# Patient Record
Sex: Female | Born: 1985 | Hispanic: Yes | Marital: Married | Smoking: Never smoker
Health system: Southern US, Community
[De-identification: ages and names within clinical notes are randomized; demographics above are authoritative.]

## PROBLEM LIST (undated history)

## (undated) DIAGNOSIS — E785 Hyperlipidemia, unspecified: Secondary | ICD-10-CM

## (undated) DIAGNOSIS — T7840XA Allergy, unspecified, initial encounter: Secondary | ICD-10-CM

## (undated) DIAGNOSIS — K589 Irritable bowel syndrome without diarrhea: Secondary | ICD-10-CM

## (undated) HISTORY — DX: Irritable bowel syndrome, unspecified: K58.9

## (undated) HISTORY — DX: Allergy, unspecified, initial encounter: T78.40XA

---

## 2019-02-08 DIAGNOSIS — K5900 Constipation, unspecified: Secondary | ICD-10-CM | POA: Insufficient documentation

## 2019-02-08 DIAGNOSIS — K5909 Other constipation: Secondary | ICD-10-CM | POA: Insufficient documentation

## 2019-08-07 DIAGNOSIS — G43009 Migraine without aura, not intractable, without status migrainosus: Secondary | ICD-10-CM | POA: Insufficient documentation

## 2020-08-14 DIAGNOSIS — Z8759 Personal history of other complications of pregnancy, childbirth and the puerperium: Secondary | ICD-10-CM | POA: Insufficient documentation

## 2020-08-14 DIAGNOSIS — R1030 Lower abdominal pain, unspecified: Secondary | ICD-10-CM | POA: Insufficient documentation

## 2020-11-09 DIAGNOSIS — M25562 Pain in left knee: Secondary | ICD-10-CM | POA: Insufficient documentation

## 2021-03-22 ENCOUNTER — Emergency Department: Payer: Self-pay

## 2021-03-22 ENCOUNTER — Emergency Department
Admission: EM | Admit: 2021-03-22 | Discharge: 2021-03-22 | Disposition: A | Discharge status: Home / Self Care | Payer: Self-pay | Attending: Emergency Medicine | Admitting: Emergency Medicine

## 2021-03-22 ENCOUNTER — Other Ambulatory Visit: Payer: Self-pay

## 2021-03-22 DIAGNOSIS — S60011A Contusion of right thumb without damage to nail, initial encounter: Secondary | ICD-10-CM | POA: Insufficient documentation

## 2021-03-22 DIAGNOSIS — W230XXA Caught, crushed, jammed, or pinched between moving objects, initial encounter: Secondary | ICD-10-CM | POA: Insufficient documentation

## 2021-03-22 DIAGNOSIS — Y990 Civilian activity done for income or pay: Secondary | ICD-10-CM | POA: Insufficient documentation

## 2021-03-22 MED ORDER — LIDOCAINE HCL (PF) 1 % IJ SOLN
5.0000 mL | Freq: Once | INTRAMUSCULAR | Status: AC
  Administered 2021-03-22: 5 mL
  Filled 2021-03-22: qty 5

## 2021-03-22 MED ORDER — NAPROXEN 500 MG PO TABS: 500.0000 mg | ORAL_TABLET | Freq: Two times a day (BID) | ORAL | Status: DC

## 2021-03-22 MED ORDER — BACITRACIN-NEOMYCIN-POLYMYXIN 400-5-5000 EX OINT
TOPICAL_OINTMENT | Freq: Once | CUTANEOUS | Status: AC
  Administered 2021-03-22: 1 via TOPICAL
  Filled 2021-03-22: qty 1

## 2021-03-22 NOTE — ED Notes (Signed)
UDS and Blood drawn for worker comp  by Joyice Faster EDT  taken to lab

## 2021-03-22 NOTE — Discharge Instructions (Addendum)
X-ray was negative for fracture.  Read and follow discharge care instruction.  Take medication as directed.

## 2021-03-22 NOTE — ED Notes (Signed)
Dressing applied to thumb with no issue.

## 2021-03-22 NOTE — ED Provider Notes (Signed)
[facility] Emergency Department Provider Note   ____________________________________________   Event Date/Time   First MD Initiated Contact with Patient 03/22/21 1129     (approximate)  I have reviewed the triage vital signs and the nursing notes.   HISTORY  Chief Complaint Finger Injury    HPI Katherine Shea is a 35 y.o. female patient complain of right thumb pain secondary to a crush injury between 2 pallets at work.  Denies loss sensation or loss of function.  Rates pain as 7/10.  Described pain as "achy".  Dressing applied prior to arrival.  Patient is right-hand dominant.         History reviewed. No pertinent past medical history.  There are no problems to display for this patient.     Prior to Admission medications   Medication Sig Start Date End Date Taking? Authorizing Provider  naproxen (NAPROSYN) 500 MG tablet Take 1 tablet (500 mg total) by mouth 2 (two) times daily with a meal. 03/22/21  Yes Joni Reining, PA-C    Allergies Patient has no known allergies.  History reviewed. No pertinent family history.  Social History    Review of Systems  Constitutional: No fever/chills Eyes: No visual changes. ENT: No sore throat. Cardiovascular: Denies chest pain. Respiratory: Denies shortness of breath. Gastrointestinal: No abdominal pain.  No nausea, no vomiting.  No diarrhea.  No constipation. Genitourinary: Negative for dysuria. Musculoskeletal: Right thumb pain. Skin: Negative for rash.  Right thumb abrasion. Neurological: Negative for headaches, focal weakness or numbness. ____________________________________________   PHYSICAL EXAM:  VITAL SIGNS: ED Triage Vitals  Enc Vitals Group     BP 03/22/21 1113 132/80     Pulse Rate 03/22/21 1113 86     Resp 03/22/21 1113 18     Temp --      Temp src --      SpO2 03/22/21 1113 96 %     Weight 03/22/21 1114 121 lb 4.1 oz (55 kg)     Height 03/22/21 1114 5\' 1"   (1.549 m)     Head Circumference --      Peak Flow --      Pain Score 03/22/21 1116 7     Pain Loc --      Pain Edu? --      Excl. in GC? --    Constitutional: Alert and oriented. Well appearing and in no acute distress. Cardiovascular: Normal rate, regular rhythm. Grossly normal heart sounds.  Good peripheral circulation. Respiratory: Normal respiratory effort.  No retractions. Lungs CTAB. Musculoskeletal: No obvious deformity to the right thumb.  Patient is moderate guarding palpation. Neurologic:  Normal speech and language. No gross focal neurologic deficits are appreciated. No gait instability. Skin:  Skin is warm, dry and intact. No rash noted.  Abrasion to right thumb. Psychiatric: Mood and affect are normal. Speech and behavior are normal.  ____________________________________________   LABS (all labs ordered are listed, but only abnormal results are displayed)  Labs Reviewed - No data to display ____________________________________________  EKG   ____________________________________________  RADIOLOGY I, 05/22/21, personally viewed and evaluated these images (plain radiographs) as part of my medical decision making, as well as reviewing the written report by the radiologist.  ED MD interpretation: No acute findings x-ray of the right thumb.  Official radiology report(s): DG Finger Thumb Right  Result Date: 03/22/2021 CLINICAL DATA:  Crushing injury to the thumb. EXAM: RIGHT THUMB 2+V COMPARISON:  None. FINDINGS: The joint spaces  are maintained.  No acute fracture is identified. IMPRESSION: No acute bony findings. Electronically Signed   By: Rudie Meyer M.D.   On: 03/22/2021 12:04    ____________________________________________   PROCEDURES  Procedure(s) performed (including Critical Care):  Procedures   ____________________________________________   INITIAL IMPRESSION / ASSESSMENT AND PLAN / ED COURSE  As part of my medical decision making, I  reviewed the following data within the electronic MEDICAL RECORD NUMBER         Patient presents with pain edema secondary to contusion of right thumb between 2 pallets.  Discussed no acute fracture on x-ray.  Wound was clean bandaged and splinted.  Patient given discharge care instruction and prescription naproxen.  Return back if condition worsens.      ____________________________________________   FINAL CLINICAL IMPRESSION(S) / ED DIAGNOSES  Final diagnoses:  Contusion of right thumb without damage to nail, initial encounter     ED Discharge Orders          Ordered    naproxen (NAPROSYN) 500 MG tablet  2 times daily with meals        03/22/21 1224             Note:  This document was prepared using Dragon voice recognition software and may include unintentional dictation errors.    Joni Reining, PA-C 03/22/21 1227    Arnaldo Natal, MD 03/22/21 1622

## 2021-03-22 NOTE — ED Triage Notes (Signed)
First Nurse note:  While at work, right thumb was crushed between two pallets.  Patient works at [employer]

## 2021-05-02 ENCOUNTER — Other Ambulatory Visit: Payer: Self-pay

## 2021-05-02 ENCOUNTER — Encounter: Payer: Self-pay | Admitting: Family Medicine

## 2021-05-02 ENCOUNTER — Ambulatory Visit (INDEPENDENT_AMBULATORY_CARE_PROVIDER_SITE_OTHER): Payer: BC Managed Care – PPO | Admitting: Family Medicine

## 2021-05-02 VITALS — BP 124/94 | HR 66 | Temp 98.4°F | Ht 61.0 in | Wt 120.0 lb

## 2021-05-02 DIAGNOSIS — I881 Chronic lymphadenitis, except mesenteric: Secondary | ICD-10-CM

## 2021-05-02 MED ORDER — DICLOFENAC SODIUM 50 MG PO TBEC: 50.0000 mg | DELAYED_RELEASE_TABLET | Freq: Two times a day (BID) | ORAL | 0 refills | Status: DC | PRN

## 2021-05-02 NOTE — Patient Instructions (Signed)
-   Referral coordinator will contact you for mammogram and ultrasound tests - Referral coordinator will contact you for follow-up with gynecology - Can dose diclofenac every 12 hours on an as-needed basis for pain (take with food, no other NSAIDs while on this medication) - Can utilize ice at 20-minute intervals, heat, and Tylenol for additional pain control - Return for follow-up in 6 weeks, contact her office for any questions between now and then

## 2021-05-02 NOTE — Assessment & Plan Note (Signed)
Patient with 40-month history of left axillary "cyst "complaint, notes a fluctuation in size, no drainage from the area, notes intermittent erythema at the same area, very tender.  She denies any similar symptoms on the contralateral side, no history of similar symptoms in collaterally in the past, does have a history of mastitis during her 2 previous pregnancies involving the nipple area alone.  She denies any nipple discharge, no constitutional symptoms including fever, weight changes, etc.  Treatments have included 200 mg ibuprofen sporadically, ice, both with limited benefit.  Physical examination of the left axillary and upper-outer breast region reveals no skin color changes, no skin dimpling, no discrete masses.  Axillary hair is groomed without evidence of surrounding erythema.  At the 3-4 o'clock position from the left nipple roughly 6-7 cm superolaterally there is palpable tender nodularity/lymphadenopathy scattered in the distribution of the subscapular and pectoral distribution, contralateral shotty nontender nodularity in the same distribution.  No fluid can be expressed with palpation about the left-sided nodularity.  Plan for mammography and ultrasound studies, referral to gynecology, and as needed dosing of diclofenac in addition to supportive care.  We will coordinate a close follow-up follow-up in 6 weeks.

## 2021-05-02 NOTE — Progress Notes (Signed)
     Primary Care / Sports Medicine Office Visit  Patient Information:  Patient ID: Katherine Shea, female DOB: 25-Mar-1986 Age: 35 y.o. MRN: [id number]   Katherine Shea is a pleasant 35 y.o. 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  No results found.   Independent interpretation of notes and tests performed by another provider:   None  Procedures performed:   None  Pertinent History, Exam, Impression, and Recommendations:   Chronic axillary lymphadenitis Patient with 64-month history of left axillary "cyst "complaint, notes a fluctuation in size, no drainage from the area, notes intermittent erythema at the same area, very tender.  She denies any similar symptoms on the contralateral side, no history of similar symptoms in collaterally in the past, does have a history of mastitis during her 2 previous pregnancies involving the nipple area alone.  She denies any nipple discharge, no constitutional symptoms including fever, weight changes, etc.  Treatments have included 200 mg ibuprofen sporadically, ice, both with limited benefit.  Physical examination of the left axillary and upper-outer breast region reveals no skin color changes, no skin dimpling,  no discrete masses.  Axillary hair is groomed without evidence of surrounding erythema.  At the 3-4 o'clock position from the left nipple roughly 6-7 cm superolaterally there is palpable tender nodularity/lymphadenopathy scattered in the distribution of the subscapular and pectoral distribution, contralateral shotty nontender nodularity in the same distribution.  No fluid can be expressed with palpation about the left-sided nodularity.  Plan for mammography and ultrasound studies, referral to gynecology, and as needed dosing of diclofenac in addition to supportive care.  We will coordinate a close follow-up follow-up in 6 weeks.   Orders & Medications Meds ordered this encounter  Medications   diclofenac (VOLTAREN) 50 MG EC tablet    Sig: Take 1 tablet (50 mg total) by mouth 2 (two) times daily as needed (pain).    Dispense:  60 tablet    Refill:  0   Orders Placed This Encounter  Procedures   MM Digital Diagnostic Bilat   8-month BREAST BILATERAL   Ambulatory referral to Gynecology     Return in about 6 weeks (around 06/13/2021).     06/15/2021, MD   Primary Care Sports Medicine [facility]

## 2021-06-03 ENCOUNTER — Encounter: Payer: BC Managed Care – PPO | Admitting: Obstetrics and Gynecology

## 2021-06-18 ENCOUNTER — Ambulatory Visit: Payer: BC Managed Care – PPO | Admitting: Family Medicine

## 2021-07-05 ENCOUNTER — Encounter: Payer: BC Managed Care – PPO | Admitting: Obstetrics and Gynecology

## 2021-07-23 ENCOUNTER — Other Ambulatory Visit: Payer: Self-pay

## 2021-07-23 DIAGNOSIS — I881 Chronic lymphadenitis, except mesenteric: Secondary | ICD-10-CM

## 2021-08-06 ENCOUNTER — Other Ambulatory Visit: Payer: Self-pay

## 2021-08-06 ENCOUNTER — Inpatient Hospital Stay: Admission: RE | Admit: 2021-08-06 | Payer: Self-pay | Source: Ambulatory Visit

## 2021-08-15 ENCOUNTER — Encounter: Payer: BC Managed Care – PPO | Admitting: Obstetrics and Gynecology

## 2021-09-02 ENCOUNTER — Encounter: Payer: Self-pay | Admitting: Obstetrics and Gynecology

## 2021-09-02 ENCOUNTER — Other Ambulatory Visit (HOSPITAL_COMMUNITY)
Admission: RE | Admit: 2021-09-02 | Discharge: 2021-09-02 | Disposition: A | Discharge status: Home / Self Care | Payer: Self-pay | Source: Ambulatory Visit | Attending: Obstetrics and Gynecology | Admitting: Obstetrics and Gynecology

## 2021-09-02 ENCOUNTER — Other Ambulatory Visit: Payer: Self-pay

## 2021-09-02 ENCOUNTER — Ambulatory Visit (INDEPENDENT_AMBULATORY_CARE_PROVIDER_SITE_OTHER): Payer: BC Managed Care – PPO | Admitting: Obstetrics and Gynecology

## 2021-09-02 VITALS — BP 120/80 | Ht 60.0 in | Wt 126.0 lb

## 2021-09-02 DIAGNOSIS — Z124 Encounter for screening for malignant neoplasm of cervix: Secondary | ICD-10-CM | POA: Diagnosis not present

## 2021-09-02 DIAGNOSIS — R875 Abnormal microbiological findings in specimens from female genital organs: Secondary | ICD-10-CM | POA: Diagnosis not present

## 2021-09-02 DIAGNOSIS — R102 Pelvic and perineal pain: Secondary | ICD-10-CM | POA: Diagnosis not present

## 2021-09-02 DIAGNOSIS — R2232 Localized swelling, mass and lump, left upper limb: Secondary | ICD-10-CM

## 2021-09-02 DIAGNOSIS — N941 Unspecified dyspareunia: Secondary | ICD-10-CM

## 2021-09-02 DIAGNOSIS — N771 Vaginitis, vulvitis and vulvovaginitis in diseases classified elsewhere: Secondary | ICD-10-CM

## 2021-09-02 MED ORDER — METRONIDAZOLE 500 MG PO TABS: 500.0000 mg | ORAL_TABLET | Freq: Two times a day (BID) | ORAL | 0 refills | Status: DC

## 2021-09-02 NOTE — Patient Instructions (Signed)
Dolor p?lvico en la mujer ?Pelvic Pain, Female ?El dolor p?lvico se siente en la parte inferior del abdomen, debajo del ombligo y a nivel de las caderas. El dolor puede comenzar en forma repentina (ser agudo), reaparecer (ser recurrente) o durar mucho tiempo (volverse cr?nico). El dolor p?lvico que dura m?s de 6 meses se considera cr?nico. ?El dolor p?lvico puede afectar: ?Los ?rganos genitales. ?El sistema urinario. ?El tubo digestivo. ?El sistema musculoesquel?tico. ?El dolor p?lvico puede tener muchas causas posibles. A veces, el dolor puede ser Neomia Dear consecuencia de afecciones digestivas o urinarias, m?sculos o ligamentos distendidos, o afecciones del aparato reproductivo. A veces, la causa del dolor p?lvico no se conoce. ?Siga estas instrucciones en su casa: ? ?Use los medicamentos de venta libre y los recetados solamente como se lo haya indicado el m?dico. ?Haga reposo como se lo haya indicado el m?dico. ?No tenga sexo si siente dolor. ?Lleve un registro del dolor p?lvico. Escriba los siguientes datos: ?Cu?ndo comenz? Chief Technology Officer. ?La ubicaci?n del dolor. ?Qu? parece mejorar o empeorar el dolor, como alimentos o su per?odo mensual (ciclo menstrual). ?Cualquier s?ntoma que se presente junto con Chief Technology Officer. ?Concurra a todas las visitas de seguimiento. Esto es importante. ?Comun?quese con un m?dico si: ?El medicamento no [employer], o el dolor regresa. ?Aparecen nuevos s?ntomas. ?Tiene sangrado o secreci?n vaginal anormal, lo que incluye sangrado despu?s de la menopausia. ?Tiene fiebre o escalofr?os. ?Tiene estre?imiento. ?Observa sangre en la orina o en la materia fecal (heces). ?La orina tiene mal olor. ?Se siente d?bil o siente que va a desvanecerse. ?Solicite ayuda de inmediato si: ?Tiene un dolor repentino e intenso. ?El dolor se intensifica de [place] continua. ?Siente dolor intenso junto con fiebre, n?useas, v?mitos o sudoraci?n excesiva. ?Pierde la conciencia. ?Estos s?ntomas pueden representar un problema  grave que constituye Radio broadcast assistant. No espere a ver si los s?ntomas desaparecen. Solicite atenci?n m?dica de inmediato. Comun?quese con el servicio de emergencias de su localidad (911 en los [place]). No conduzca por sus propios medios [employer]. ?Resumen ?El dolor p?lvico se siente en la parte inferior del abdomen, debajo del ombligo y a nivel de las caderas. ?El dolor p?lvico puede tener muchas causas posibles. ?Lleve un registro del dolor p?lvico. ?Esta informaci?n no tiene Theme park manager el consejo del m?dico. Aseg?rese de hacerle al m?dico cualquier pregunta que tenga. ?Document Revised: 11/01/2020 Document Reviewed: 11/01/2020 ?Elsevier Patient Education ? 2022 Elsevier Inc. ? ?

## 2021-09-02 NOTE — Progress Notes (Signed)
? ?Patient ID: Katherine Shea, female   DOB: [birth year], 36 y.o.   MRN: [id number] ? ?Reason for Consult: Dyspareunia ?  ?Referred by Jerrol Banana, MD ? ?Subjective:  ?   ?HPI: ? ?Katherine Shea is a 35 y.o. female she is here today with complaints of pain with intercourse and breast concerns.  She previously was referred for mammogram and diagnostic breast imaging although she did not go.  She is uncomfortable and large mass in her left axilla which makes it difficult to wear a bra.  She reports that she missed her breast imaging appointments because she was sick with COVID. ? ?She reports that she has had pain with intercourse for about 1 year.  She reports that she that she generally has pain with penetration.  She reports difficulty to describe the pain but it feels like she is a full bottle and someone is trying to stuff more things inside.  She describes the pain as sharp and around the area of both of her ovaries.  She pain reports that the pain will come and go but is not always present.  She has noted no changes in discharge.  She has noted an abnormal odor especially around the time of her menstrual cycle which smells like rotten meat.  She takes Tylenol for the pain which sometimes helps.  She reports that the pain will linger for about 24 hours after intercourse.  Occasionally she will have painful menstrual cycles.  She has tried to treat herself with over-the-counter vaginal inserts with no improvement in her symptoms.  She reports the same sexual partner for the last 17 years. ? ?Gynecological History ? ?No LMP recorded. Patient has had an injection. ?Menarche: 10 ?Menopause: not applicable ? ?She reports passage of large clots ?She reports sensations of gushing or flooding of blood. ?She reports accidents where she bleeds through her clothing. ?She reports that she changes a saturated pad or tampon more frequently than every hour.  ?She denies that pain from her periods limits her  activities. ? ?History of fibroids, polyps, or ovarian cysts? : yes  ?History of PCOS? no ?Hstory of Endometriosis? no ?History of abnormal pap smears? yes ?Have you had any sexually transmitted infections in the past? no ? ?She reports HPV vaccination in the past.  ? ?Last Pap: Results were: 2020 NIL  ? ?She identifies as a female. She is sexually active with men.  ? She has dyspareunia. She denies postcoital bleeding.  ? ?Obstetrical History ?OB History  ?Gravida Para Term Preterm AB Living  ?2 2 2     2   ?SAB IAB Ectopic Multiple Live Births  ?           ?  ?# Outcome Date GA Lbr Len/2nd Weight Sex Delivery Anes PTL Lv  ?2 Term           ?1 Term           ? ? ? ?Past Medical History:  ?Diagnosis Date  ? Allergy   ? IBS (irritable bowel syndrome)   ? ?Family History  ?Problem Relation Age of Onset  ? Hypertension Mother   ? Asthma Brother   ? Asthma Daughter   ? Brain cancer Maternal Grandmother   ? Prostate cancer Maternal Grandfather   ? ?Past Surgical History:  ?Procedure Laterality Date  ? CESAREAN SECTION  2013  ? TUBAL LIGATION  2013  ? ? ?Short Social History:  ?Social History  ? ?Tobacco Use  ?  Smoking status: Never  ? Smokeless tobacco: Never  ?Substance Use Topics  ? Alcohol use: Never  ? ? ?No Known Allergies ? ?Current Outpatient Medications  ?Medication Sig Dispense Refill  ? polyethylene glycol powder (GLYCOLAX/MIRALAX) 17 GM/SCOOP powder Take 17 g by mouth in the morning and at bedtime.    ? diclofenac (VOLTAREN) 50 MG EC tablet Take 1 tablet (50 mg total) by mouth 2 (two) times daily as needed (pain). (Patient not taking: Reported on 09/02/2021) 60 tablet 0  ? TRULANCE 3 MG TABS Take 1 tablet by mouth daily. (Patient not taking: Reported on 09/02/2021)    ? ?No current facility-administered medications for this visit.  ? ? ?Review of Systems  ?Constitutional: Negative for chills, fatigue, fever and unexpected weight change.  ?HENT: Negative for trouble swallowing.  ?Eyes: Negative for loss of vision.   ?Respiratory: Negative for cough, shortness of breath and wheezing.  ?Cardiovascular: Negative for chest pain, leg swelling, palpitations and syncope.  ?GI: Negative for abdominal pain, blood in stool, diarrhea, nausea and vomiting.  ?GU: Negative for difficulty urinating, dysuria, frequency and hematuria.  ?Musculoskeletal: Negative for back pain, leg pain and joint pain.  ?Skin: Negative for rash.  ?Neurological: Negative for dizziness, headaches, light-headedness, numbness and seizures.  ?Psychiatric: Negative for behavioral problem, confusion, depressed mood and sleep disturbance.   ? ?   ?Objective:  ?Objective  ? ?Vitals:  ? 09/02/21 0916  ?BP: 120/80  ?Weight: 126 lb (57.2 kg)  ?Height: 5' (1.524 m)  ? ?Body mass index is 24.61 kg/m?. ? ?Physical Exam ?Vitals and nursing note reviewed. Exam conducted with a chaperone present.  ?Constitutional:   ?   Appearance: Normal appearance. She is well-developed.  ?HENT:  ?   Head: Normocephalic and atraumatic.  ?Eyes:  ?   Extraocular Movements: Extraocular movements intact.  ?   Pupils: Pupils are equal, round, and reactive to light.  ?Cardiovascular:  ?   Rate and Rhythm: Normal rate and regular rhythm.  ?Pulmonary:  ?   Effort: Pulmonary effort is normal. No respiratory distress.  ?   Breath sounds: Normal breath sounds.  ?Abdominal:  ?   General: Abdomen is flat.  ?   Palpations: Abdomen is soft.  ?Genitourinary: ?   Comments: External: Normal appearing vulva. No lesions noted.  ?Speculum examination: Normal appearing cervix. Small blood in the vaginal vault.  Bimanual examination: Uterus midline, +-tender, normal in size, shape and contour.  No CMT. No adnexal masses. +l tenderness. Pelvis not fixed. ? ?Breast exam: breast equal without skin changes, nipple discharge, breast lump or enlarged lymph nodes. Patient reports bilateral breast tenderness. Left axilla enlarged, possible lipoma ?Musculoskeletal:     ?   General: No signs of injury.  ?   Cervical back:  Normal range of motion.  ?Skin: ?   General: Skin is warm and dry.  ?Neurological:  ?   General: No focal deficit present.  ?   Mental Status: She is alert and oriented to person, place, and time.  ?Psychiatric:     ?   Behavior: Behavior normal.     ?   Thought Content: Thought content normal.     ?   Judgment: Judgment normal.  ? ? ?Assessment/Plan:  ?  ?36 yo G2P2002 ? ?Left axilla mass possible lipoma.  Encourage patient to follow-up with imaging.  Amatory referral for general surgery. ?Pelvic pain and dyspareunia.  We will treat for bacterial vaginosis with 7 days of Flagyl.  Pelvic ultrasound  ordered.  Infection testing today.  Follow-up after pelvic ultrasound.  Provided with information regarding pelvic pain. ?Pap smear today ?Return for follow-up after pelvic ultrasound ? ?More than 30 minutes were spent face to face with the patient in the room, reviewing the medical record, labs and images, and coordinating care for the patient. The plan of management was discussed in detail and counseling was provided.  ? ?  ? ?Adelene Idler MD ?[facility], [facility] ?09/02/2021 ?9:25 AM ? ? ?

## 2021-09-03 LAB — CYTOLOGY - PAP
Chlamydia: NEGATIVE
Comment: NEGATIVE
Comment: NEGATIVE
Comment: NEGATIVE
Comment: NORMAL
Diagnosis: NEGATIVE
High risk HPV: NEGATIVE
Neisseria Gonorrhea: NEGATIVE
Trichomonas: NEGATIVE

## 2021-09-04 LAB — NUSWAB BV AND CANDIDA, NAA
Candida albicans, NAA: NEGATIVE
Candida glabrata, NAA: NEGATIVE

## 2021-09-04 NOTE — Progress Notes (Signed)
Called pt, no answer, LVMTRC. 

## 2021-09-05 NOTE — Progress Notes (Signed)
Pt aware.

## 2021-09-10 ENCOUNTER — Ambulatory Visit: Payer: BC Managed Care – PPO | Admitting: Surgery

## 2021-09-16 ENCOUNTER — Ambulatory Visit: Admission: RE | Admit: 2021-09-16 | Payer: Self-pay | Source: Ambulatory Visit

## 2021-09-19 ENCOUNTER — Other Ambulatory Visit: Payer: BC Managed Care – PPO

## 2021-09-23 ENCOUNTER — Ambulatory Visit: Payer: BC Managed Care – PPO | Admitting: Surgery

## 2021-10-01 ENCOUNTER — Ambulatory Visit: Payer: BC Managed Care – PPO | Admitting: Obstetrics and Gynecology

## 2021-10-02 ENCOUNTER — Ambulatory Visit: Payer: BC Managed Care – PPO | Admitting: Surgery

## 2021-10-03 ENCOUNTER — Ambulatory Visit: Payer: BC Managed Care – PPO

## 2021-10-09 ENCOUNTER — Ambulatory Visit: Payer: BC Managed Care – PPO | Admitting: Surgery

## 2021-10-16 ENCOUNTER — Ambulatory Visit: Payer: BC Managed Care – PPO | Admitting: Obstetrics and Gynecology

## 2021-10-18 ENCOUNTER — Other Ambulatory Visit: Payer: Self-pay

## 2021-10-18 ENCOUNTER — Encounter: Payer: Self-pay | Admitting: Surgery

## 2021-10-18 ENCOUNTER — Ambulatory Visit (INDEPENDENT_AMBULATORY_CARE_PROVIDER_SITE_OTHER): Payer: BC Managed Care – PPO | Admitting: Surgery

## 2021-10-18 VITALS — BP 119/71 | HR 72 | Temp 98.0°F | Ht 62.0 in | Wt 126.0 lb

## 2021-10-18 DIAGNOSIS — N6011 Diffuse cystic mastopathy of right breast: Secondary | ICD-10-CM

## 2021-10-18 DIAGNOSIS — R2232 Localized swelling, mass and lump, left upper limb: Secondary | ICD-10-CM

## 2021-10-18 DIAGNOSIS — N6012 Diffuse cystic mastopathy of left breast: Secondary | ICD-10-CM | POA: Diagnosis not present

## 2021-10-18 NOTE — Patient Instructions (Addendum)
Your Mammogram is scheduled for 11/08/2021 at 1 pm @ [facility] at [facility].  ? ? ?If you have any concerns or questions, please feel free to call our office.  ? ? ?Autoexamen de mamas ?Breast Self-Awareness ?El autoexamen de mamas es para Katherine Shea, engineering apariencia y la sensibilidad de las [place]. Es necesario que respete estas indicaciones: ?Controle sus mamas peri?dicamente. ?Informe al m?dico si hay alg?n cambio. ?Familiar?cese con el aspecto y con la forma en que se sienten sus mamas al palparlas. Esto puede ayudarle a Nurse, children's en las mamas mientras todav?a es peque?o y puede tratarse. Debe hacerse un autoexamen de mamas incluso si tiene implantes mamarios. ?Lo que necesita: ?Un espejo. ?Una habitaci?n bien iluminada. ?Una almohada u otro objeto blando. ?C?mo realizar el autoexamen de mamas ?Siga estos pasos para realizar un autoexamen de mamas: ?Busque cambios ? ?Qu?tese toda la ropa por encima de la cintura. ?P?rese frente a un espejo en una habitaci?n con buena iluminaci?n. ?Coloque las manos a los costados. ?Compare las mamas en el espejo. Busque diferencias entre ellas, por ejemplo: ?Una diferencia en la forma. ?Una diferencia en el tama?o. ?Arrugas, depresiones y protuberancias en Katherine Shea y no en la otra. ?Observe cada mama para buscar cambios en la piel, por ejemplo: ?Enrojecimiento. ?Zonas escamosas. ?Piel que se ha engrosado. ?Hoyuelos. ?Llagas abiertas (?lceras). ?Observe si hay cambios en los pezones, por ejemplo: ?L?quido que sale de un pez?n. ?L?quido alrededor de un pez?n. ?Sangrado. ?Hoyuelos. ?Enrojecimiento. ?Un pez?n que parece metido hacia adentro (retra?do) o que ha cambiado de posici?n. ?Palpe si hay cambios ?Acu?stese boca arriba. ?P?lpese cada mama. Para hacer esto: ?Elija una mama para palpar. ?Coloque una almohada debajo del hombro m?s cercano a esa mama. Coloque el brazo m?s cercano a esa mama por detr?s de la cabeza. ?P?lpese la Shea del pez?n de esa  mama con la mano del otro brazo. P?lpese la Shea con las yemas de los tres dedos del medio y haga c?rculos con los dedos. Utilice una presi?n ligera, media y Katherine Shea. ?Contin?e superponiendo c?rculos, movi?ndose hacia abajo por encima de la mama. Contin?e haciendo c?rculos con los dedos. Det?ngase cuando sienta las costillas. ?Empiece a hacer c?rculos con los dedos nuevamente, esta vez haciendo movimientos ascendentes hasta llegar a la clav?cula. ?Luego, haga c?rculos hacia afuera, de un lado a otro de la mama y Katherine Shea de la axila. ?Apriete el pez?n. Compruebe si hay secreci?n y bultos. ?Repita estos pasos para examinar la otra mama. ?Si?ntese o p?rese en la ducha o la ba?era. ?Con agua jabonosa en la piel, p?lpese cada mama del mismo modo que lo hizo Katherine Shea sus hallazgos ?Anotar lo que encuentra puede ayudarla a recordar qu? contarle al m?dico. Escriba los siguientes datos: ?Qu? es normal para cada mama. ?Cualquier cambio que haya encontrado en cada mama. Estos incluyen: ?La clase de cambios que encuentra. ?Dolor o sensibilidad en la mama. ?Cualquier bulto que encuentre. Anote el tama?o y d?nde se encuentra. ?Cu?ndo tuvo su ?ltimo per?odo (ciclo menstrual). ?Consejos generales ?Si est? amamantando, el mejor momento para el examen de las mamas es despu?s de darle de Psychologist, clinical al beb? o despu?s de Shea engineer. ?Si tiene [employer], el mejor momento para examinarse las [place] es de 5 a 7 d?as despu?s de finalizado su ciclo mensual. ?Con el tiempo, se sentir? c?moda con el autoexamen. Tambi?n comenzar? a saber si hay cambios en sus mamas. ?Comun?quese con un m?dico si: ?Katherine Shea  un cambio en la forma o el tama?o de las mamas o los pezones. ?Observa un cambio en la piel de las mamas o los pezones, como la piel enrojecida o escamosa. ?Tiene secreci?n de l?quido proveniente de los pezones que no es normal. ?Katherine Shea un nuevo bulto o una Shea engrosada. ?Tiene dolor de  [name]. ?Tiene alguna inquietud Katherine Shea de la Katherine Shea. ?Resumen ?El autoexamen de mamas incluye buscar cambios en las mamas y palpar para Katherine Shea cualquier cambio en las Katherine Shea. ?Debe hacer el autoexamen de mamas frente a un espejo en una habitaci?n bien iluminada. ?Si tiene per?odos (ciclos menstruales), el mejor momento para examinarse las mamas es de 5 a 7 d?as despu?s de finalizado el per?odo menstrual. ?Informe al m?dico cualquier cambio que vea en sus mamas. Los cambios incluyen cambios en el tama?o, cambios en la piel, Engineer, mining o sensibilidad, o l?quido que sale de los pezones que no es normal. ?Esta informaci?n no tiene Theme park manager el consejo del m?dico. Aseg?rese de hacerle al m?dico cualquier pregunta que tenga. ?Document Revised: 04/24/2021 Document Reviewed: 04/24/2021 ?Elsevier Patient Education ? 2023 Elsevier Inc. ? ?

## 2021-10-18 NOTE — Progress Notes (Signed)
?10/18/2021 ? ?Reason for Visit: Left axillary mass ? ?Requesting Provider: Adelene Idler, MD ? ?History of Present Illness: ?Katherine Shea is a 36 y.o. female presenting for evaluation of left axillary mass.  The patient was seen by Dr. Jerene Pitch on 09/02/2021 for pelvic pain as well as a left axillary mass.  She was previously seen by her PCP Dr. Ashley Royalty on 05/02/2021.  Diagnostic bilateral mammogram was ordered at that point but the patient did not show for the appointment.  A repeat set of mammograms and ultrasounds were ordered on 07/23/2021 but the patient also did not show for that appointment.  Dr. Gaynelle Arabian also order an ultrasound of the pelvis for evaluation of her pelvic pain but the patient also did not show for that appointment.  She has a history of multiple no-show appointments with different providers.  The patient reports that her work schedule is very inconsistent and she is sometimes unable to determine when she will be able to leave work in order to be able to make it to her appointments. ? ?The patient reports that she has had for several months now a left axillary mass that is tender to palpation.  She reports that it is tender to touch and now she cannot wear bras with wires due to the wires pinching on that area.  She feels the mass has been steady in size and has not worsened.  Sometimes she feels like that area gets firm and more tender to touch and sometimes is softer.  Denies any drainage from the skin but does note some intermittent redness.  She reports a history of mastitis with her 2 prior pregnancies and reports that she has been told that she has pretty extensive fibrocystic disease in both breasts but particularly the left breast.  Denies any masses in the right axilla and denies any palpable masses on either breast.  There is history of cancer in her family on both sides but no history of breast cancer particularly. ? ?Past Medical History: ?Past Medical History:  ?Diagnosis  Date  ? Allergy   ? IBS (irritable bowel syndrome)   ?  ? ?Past Surgical History: ?Past Surgical History:  ?Procedure Laterality Date  ? CESAREAN SECTION  2013  ? TUBAL LIGATION  2013  ? ? ?Home Medications: ?Prior to Admission medications   ?Medication Sig Start Date End Date Taking? Authorizing Provider  ?polyethylene glycol powder (GLYCOLAX/MIRALAX) 17 GM/SCOOP powder Take 17 g by mouth in the morning and at bedtime. 05/29/20  Yes [provider]  ?TRULANCE 3 MG TABS Take 1 tablet by mouth daily. 03/21/21  Yes [provider]  ? ? ?Allergies: ?No Known Allergies ? ?Social History: ? reports that she has never smoked. She has never used smokeless tobacco. She reports that she does not drink alcohol and does not use drugs.  ? ?Family History: ?Family History  ?Problem Relation Age of Onset  ? Hypertension Mother   ? Asthma Brother   ? Asthma Daughter   ? Brain cancer Maternal Grandmother   ? Prostate cancer Maternal Grandfather   ? ? ?Review of Systems: ?Review of Systems  ?Constitutional:  Negative for chills and fever.  ?HENT:  Negative for hearing loss.   ?Respiratory:  Negative for shortness of breath.   ?Cardiovascular:  Negative for chest pain.  ?Gastrointestinal:  Negative for abdominal pain, nausea and vomiting.  ?Genitourinary:  Negative for dysuria.  ?Musculoskeletal:  Negative for myalgias.  ?Skin:   ?  Left axillary mass  ?Neurological:  Negative for dizziness.  ?Psychiatric/Behavioral:  Negative for depression.   ? ?Physical Exam ?BP 119/71   Pulse 72   Temp 98 ?F (36.7 ?C) (Oral)   Ht 5\' 2"  (1.575 m)   Wt 126 lb (57.2 kg)   BMI 23.05 kg/m?  ?CONSTITUTIONAL: No acute distress, well-nourished ?HEENT:  Normocephalic, atraumatic, extraocular motion intact. ?NECK: Trachea is midline, and there is no jugular venous distension.  ?RESPIRATORY:  Normal respiratory effort without pathologic use of accessory muscles. ?CARDIOVASCULAR: Regular rhythm and rate. ?BREAST: Left breast has no  palpable masses, skin changes, or nipple changes.  She does have extensive fibrocystic changes particularly in the lateral and inferior portions of the left breast.  In the left axilla, the patient has what looks like an area of fat pad or fat excess measuring about 2-1/2 x 1/2 cm which is soft but is tender to palpation.  When doing deeper palpation in that area, she does have very small but numerous palpable lymph nodes, though the patient complains of tenderness when pressing in that area..  On the right breast, there were no palpable masses or skin changes or nipple changes but she also has fibrocystic changes laterally and inferiorly although appears to be less extensive compared to the left breast.  Right axilla does not have any palpable masses or lymphadenopathy compared to the left. ?MUSCULOSKELETAL:  Normal muscle strength and tone in all four extremities.  No peripheral edema or cyanosis. ?SKIN: Skin turgor is normal. There are no pathologic skin lesions.  ?NEUROLOGIC:  Motor and sensation is grossly normal.  Cranial nerves are grossly intact. ?PSYCH:  Alert and oriented to person, place and time. Affect is normal. ? ?Laboratory Analysis: ?No results found for this or any previous visit (from the past 24 hour(s)). ? ?Imaging: ?No results found. ? ?Assessment and Plan: ?This is a 35 y.o. female with a possible left axillary mass versus lymphadenopathy. ? ?- Discussed with the patient that on exam, the area of concern appears to be more likely to be fat pad rather than a true mass itself.  However this would not explain the alternating firmness versus softness and palpation over the intermittent redness that the patient sees.  Deeper palpation does show small but numerous palpable lymph nodes in her breast does have extensive fibrocystic changes.  The patient has not had a mammogram and has failed to show for the previous appointment scheduled.  After contacting [place], her bilateral mammogram and left  axillary ultrasound have been scheduled for 11/08/2021 after discussing more convenient dates with the patient. ?- Discussed with the patient that if her findings are more consistent with fibrocystic changes, then potentially we could try different medications to help with pain control.  If the findings show more of abnormal lymph nodes, she may require biopsy.  If the findings show more benign but enlarged lymph nodes, she may potentially only need a follow-up ultrasound in the future.  I will call the patient with the results and recommendations based on the findings. ?- All of her questions have been answered. ? ?I spent 60 minutes dedicated to the care of this patient on the date of this encounter to include pre-visit review of records, face-to-face time with the patient discussing diagnosis and management, and any post-visit coordination of care. ? ? ?11/10/2021, MD ?[facility] ? ?  ?

## 2021-11-08 ENCOUNTER — Other Ambulatory Visit: Payer: Self-pay

## 2021-11-08 ENCOUNTER — Ambulatory Visit
Admission: RE | Admit: 2021-11-08 | Discharge: 2021-11-08 | Disposition: A | Discharge status: Home / Self Care | Payer: BC Managed Care – PPO | Source: Ambulatory Visit | Attending: Surgery | Admitting: Surgery

## 2021-11-08 ENCOUNTER — Ambulatory Visit
Admission: RE | Admit: 2021-11-08 | Discharge: 2021-11-08 | Disposition: A | Discharge status: Home / Self Care | Payer: BC Managed Care – PPO | Source: Ambulatory Visit | Attending: Family Medicine | Admitting: Family Medicine

## 2021-11-08 DIAGNOSIS — R2232 Localized swelling, mass and lump, left upper limb: Secondary | ICD-10-CM | POA: Diagnosis present

## 2021-11-08 DIAGNOSIS — I881 Chronic lymphadenitis, except mesenteric: Secondary | ICD-10-CM | POA: Insufficient documentation

## 2022-03-28 ENCOUNTER — Other Ambulatory Visit: Payer: Self-pay

## 2022-03-28 DIAGNOSIS — R2232 Localized swelling, mass and lump, left upper limb: Secondary | ICD-10-CM

## 2022-03-28 DIAGNOSIS — N6011 Diffuse cystic mastopathy of right breast: Secondary | ICD-10-CM

## 2022-05-12 ENCOUNTER — Other Ambulatory Visit: Payer: BC Managed Care – PPO

## 2022-05-12 ENCOUNTER — Inpatient Hospital Stay: Admission: RE | Admit: 2022-05-12 | Payer: BC Managed Care – PPO | Source: Ambulatory Visit

## 2022-05-21 ENCOUNTER — Ambulatory Visit: Payer: BC Managed Care – PPO | Admitting: Surgery

## 2022-10-30 DIAGNOSIS — F32A Depression, unspecified: Secondary | ICD-10-CM | POA: Insufficient documentation

## 2022-10-30 DIAGNOSIS — G8929 Other chronic pain: Secondary | ICD-10-CM | POA: Insufficient documentation

## 2022-10-30 DIAGNOSIS — M159 Polyosteoarthritis, unspecified: Secondary | ICD-10-CM | POA: Insufficient documentation

## 2022-10-30 DIAGNOSIS — G43109 Migraine with aura, not intractable, without status migrainosus: Secondary | ICD-10-CM | POA: Insufficient documentation

## 2022-11-21 DIAGNOSIS — D649 Anemia, unspecified: Secondary | ICD-10-CM | POA: Insufficient documentation

## 2022-11-25 IMAGING — US US BREAST*L* LIMITED INC AXILLA
2 series · 12 of 12 positions shown · non-contrast
Comparison: None available.

CLINICAL DATA: Patient presents for tenderness within the outer
left breast.

EXAM:
DIGITAL DIAGNOSTIC BILATERAL MAMMOGRAM WITH TOMOSYNTHESIS AND CAD;
ULTRASOUND LEFT BREAST LIMITED
TECHNIQUE: Bilateral digital diagnostic mammography and breast tomosynthesis
was performed. The images were evaluated with computer-aided
detection.; Targeted ultrasound examination of the left breast was
performed.

[Series 1: us breast*left* limited inc axilla · 0.06mm/px · 8 of 8 slices shown (1 of 2)]
[im 1/8]
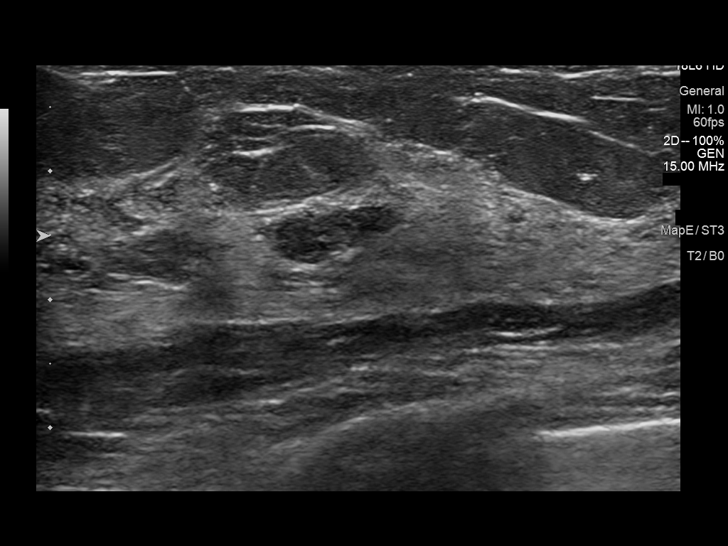
[im 2/8]
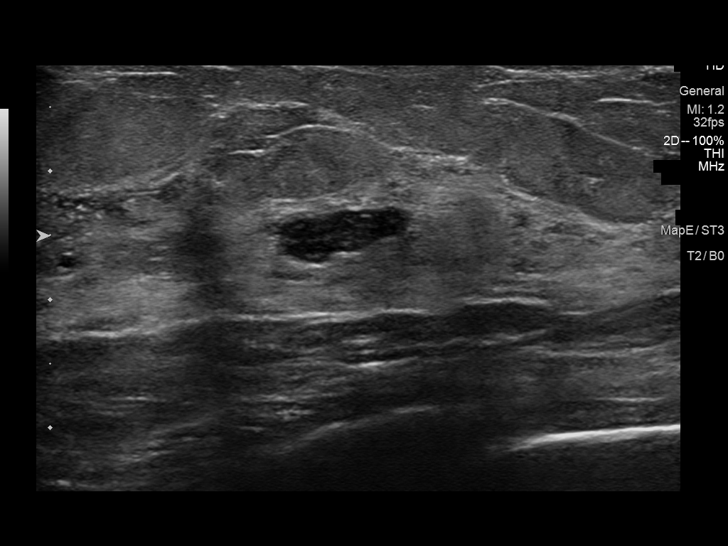
[im 3/8]
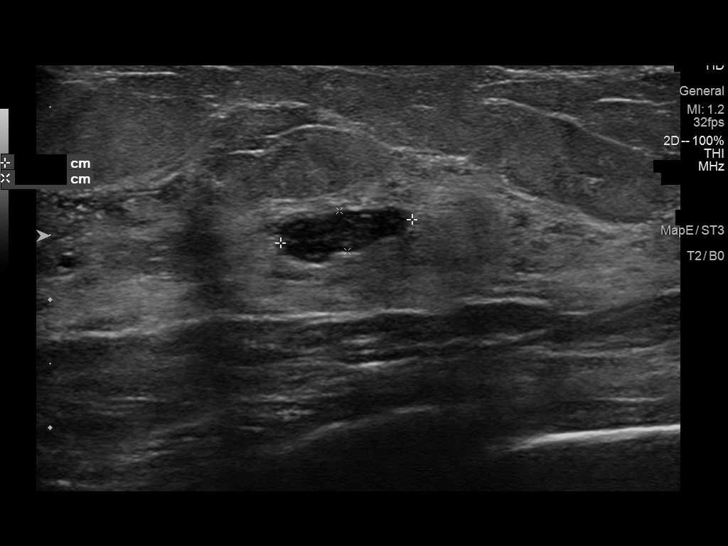
[im 4/8]
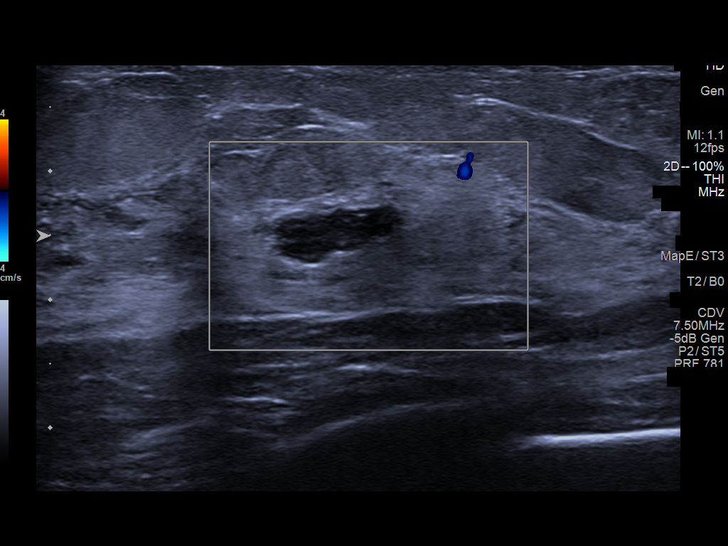
[im 5/8]
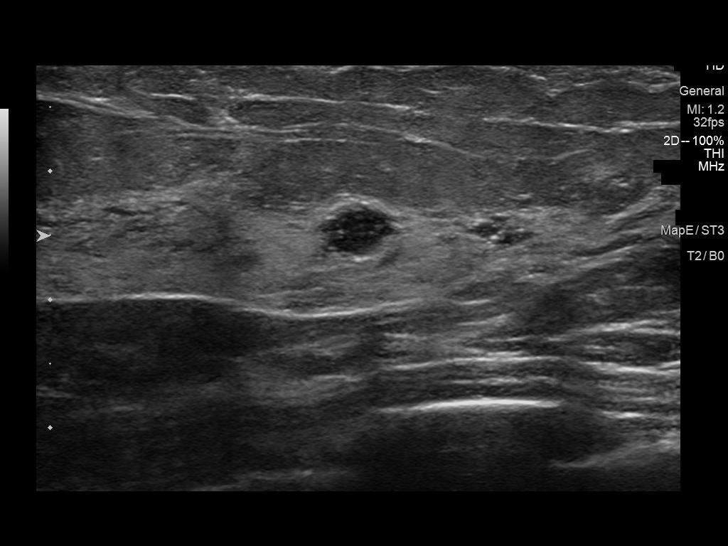
[im 6/8]
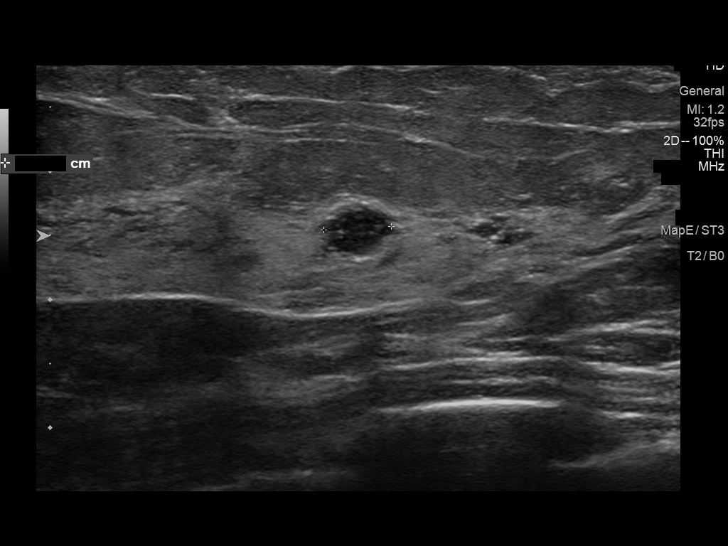
[im 7/8]
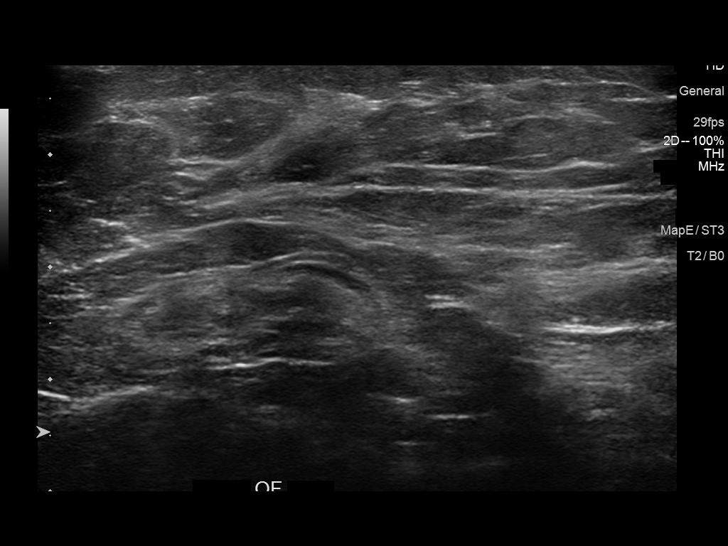
[im 8/8]
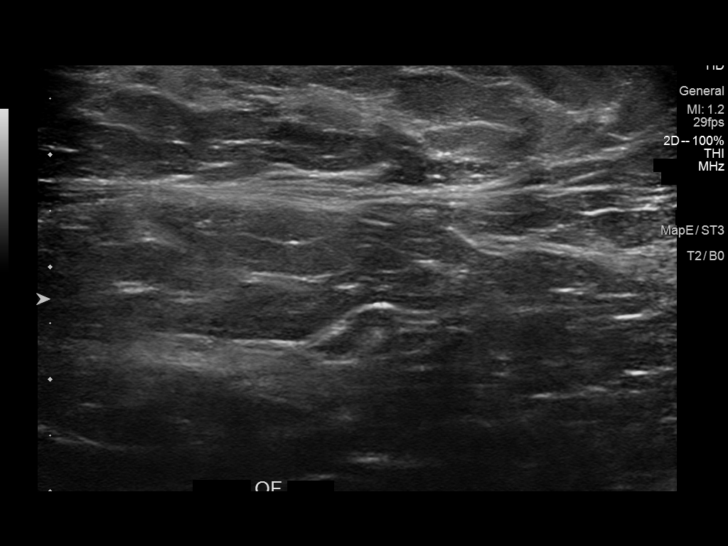

[Series 2: us breast*left* limited inc axilla · 0.06mm/px · 4 of 4 slices shown (2 of 2)]
[im 1/4]
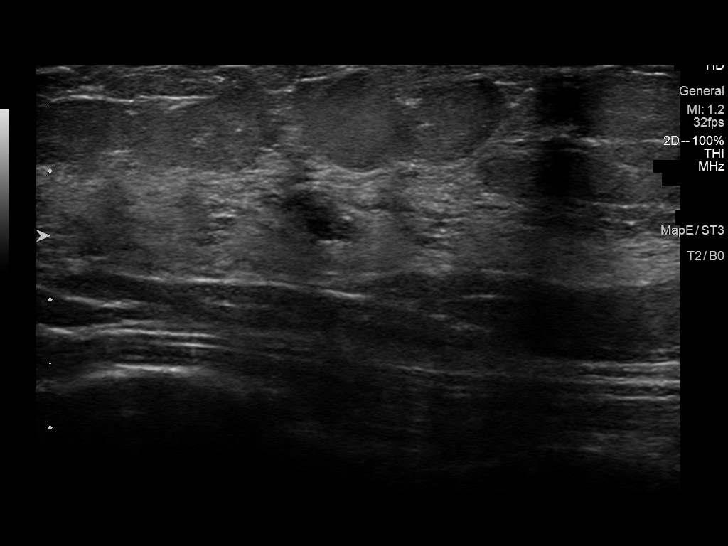
[im 2/4]
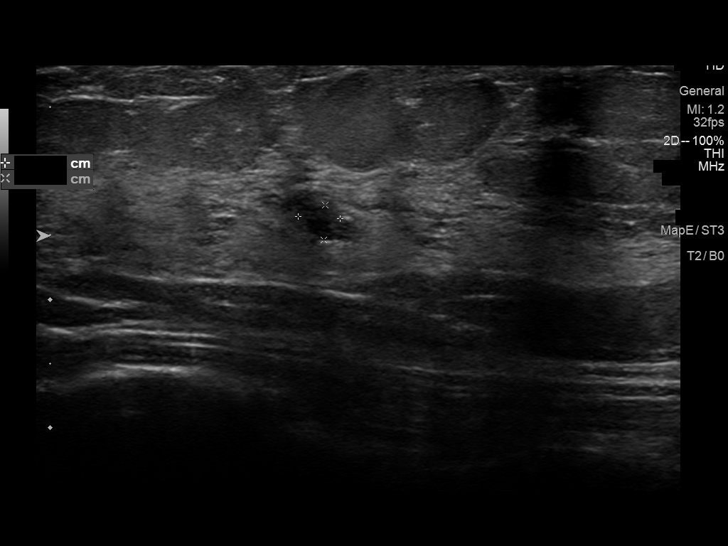
[im 3/4]
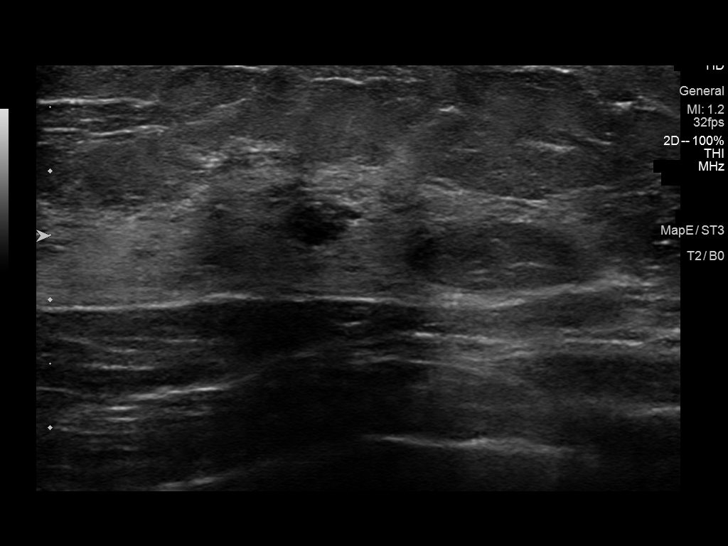
[im 4/4]
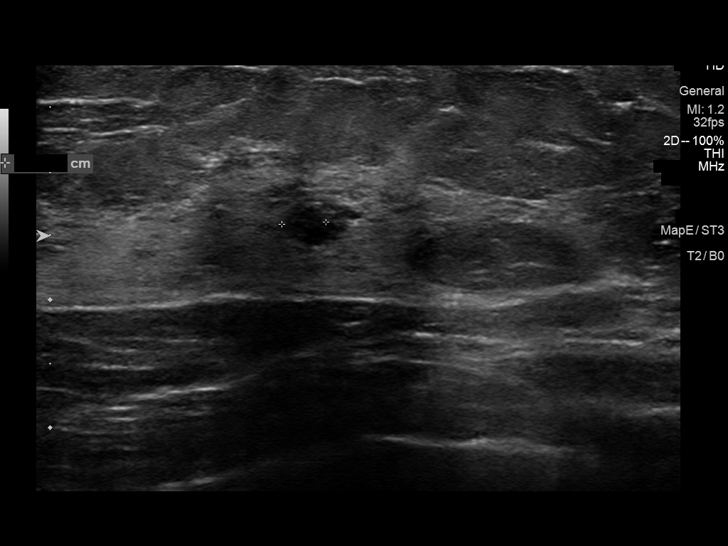

[12 of 12 positions shown; findings below may reference images not displayed]

ACR Breast Density Category c: The breast tissue is heterogeneously
dense, which may obscure small masses.
FINDINGS: Within the outer left breast middle depth there is a small oval
mass. No additional masses, calcifications or distortion identified
within either breast.

On physical exam, there is a soft palpable mass within the left
axilla. No suspicious mass palpated within the outer left breast.

Targeted ultrasound is performed, showing a 1.0 x 0.3 x 0.5 cm oval
circumscribed hypoechoic mass left breast 3:30 o'clock 3 cm from the
nipple. There is an adjacent 3 x 3 x 3 mm oval circumscribed
hypoechoic mass left breast 3 o'clock position 4 cm from nipple. No
left axillary adenopathy.
IMPRESSION: There are 2 adjacent probably benign left breast masses at the 3
o'clock and 3:30 o'clock position favored to represent complicated
cysts.

No suspicious abnormality within the left axilla at the site of
pain.

RECOMMENDATION:
Continued clinical evaluation for left axillary palpable area of
concern with associated tenderness.

Left breast diagnostic mammogram and ultrasound in 6 months to
reassess the probably benign left breast masses favored to represent
fibrocystic changes.

I have discussed the findings and recommendations with the patient.
If applicable, a reminder letter will be sent to the patient
regarding the next appointment.

BI-RADS CATEGORY  3: Probably benign.

## 2022-11-25 IMAGING — MG DIGITAL DIAGNOSTIC BILAT W/ TOMO W/ CAD
8 series · 8 of 24 positions shown · non-contrast
Comparison: None available.

CLINICAL DATA: Patient presents for tenderness within the outer
left breast.

EXAM:
DIGITAL DIAGNOSTIC BILATERAL MAMMOGRAM WITH TOMOSYNTHESIS AND CAD;
ULTRASOUND LEFT BREAST LIMITED
TECHNIQUE: Bilateral digital diagnostic mammography and breast tomosynthesis
was performed. The images were evaluated with computer-aided
detection.; Targeted ultrasound examination of the left breast was
performed.

[L CC synth-2D]
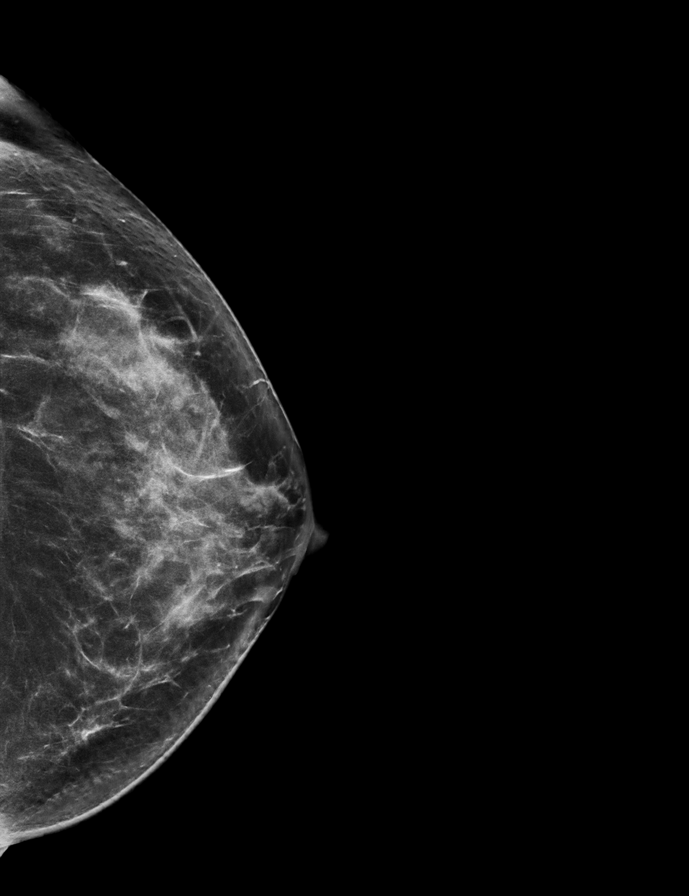

[L MLO synth-2D]
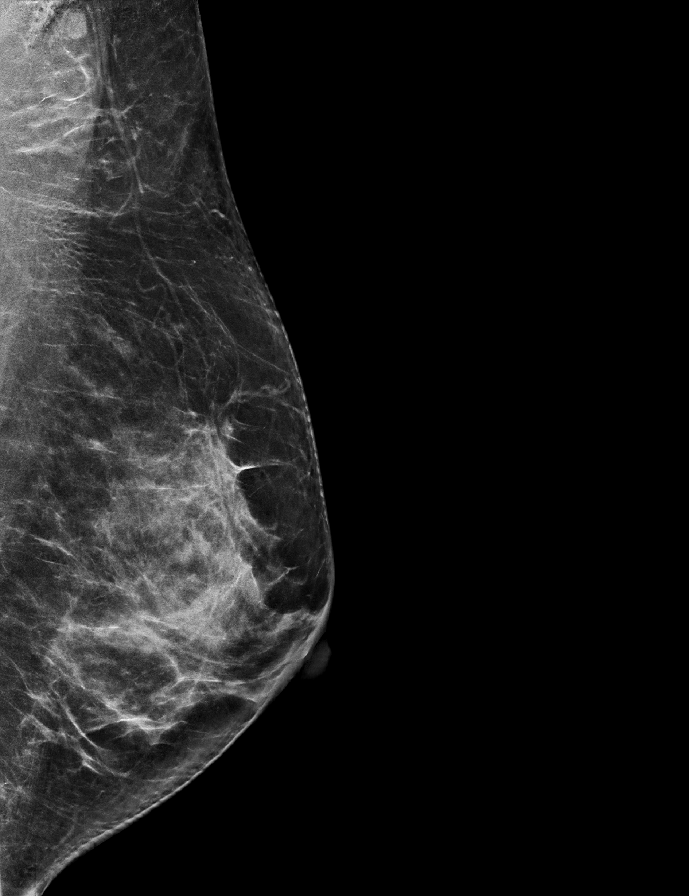

[R CC synth-2D]
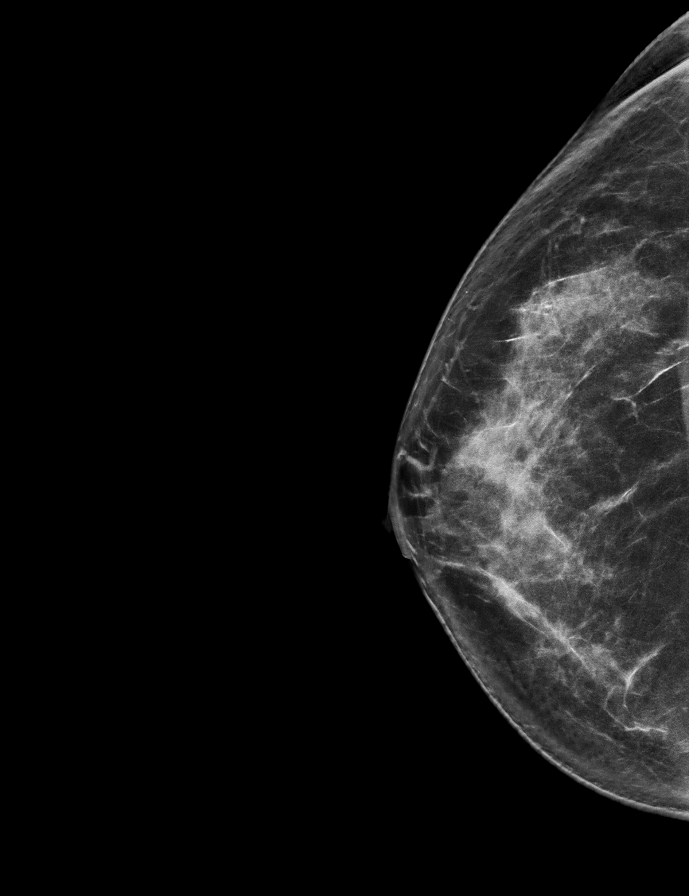

[R MLO synth-2D]
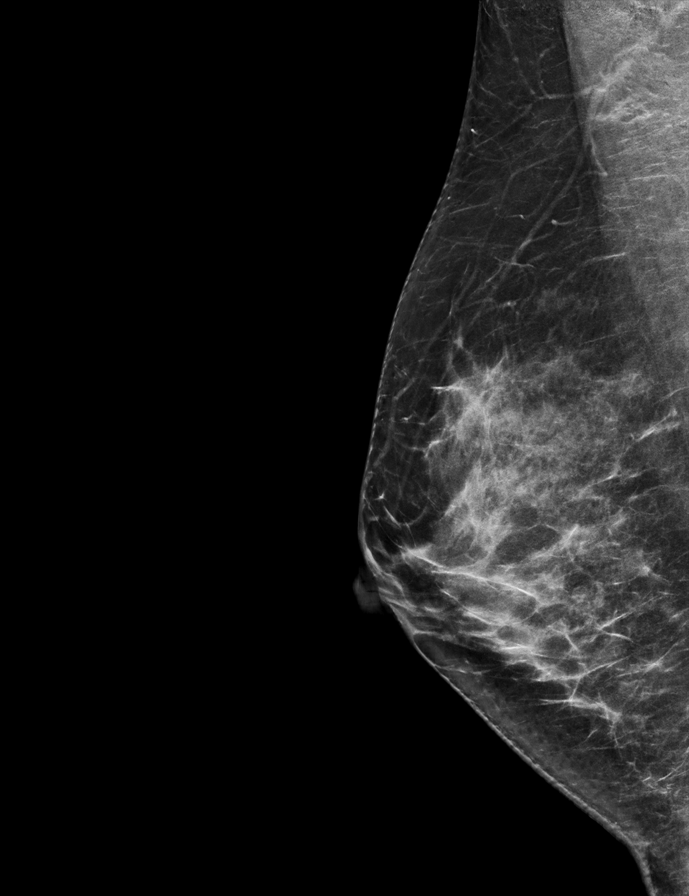

[L MLO tomo · tomo slice 39/77.0]
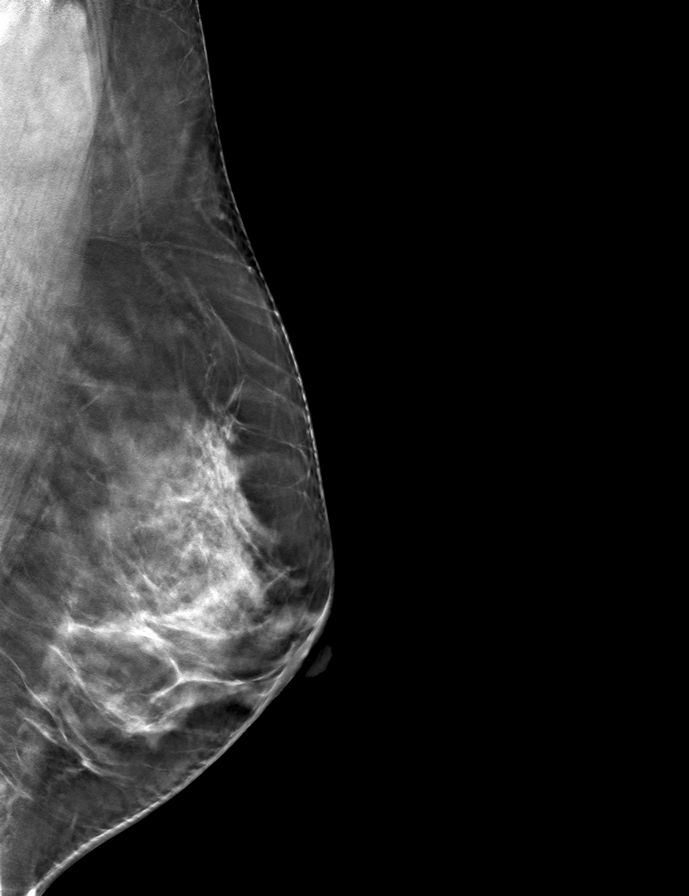

[R CC tomo · tomo slice 41/80.0]
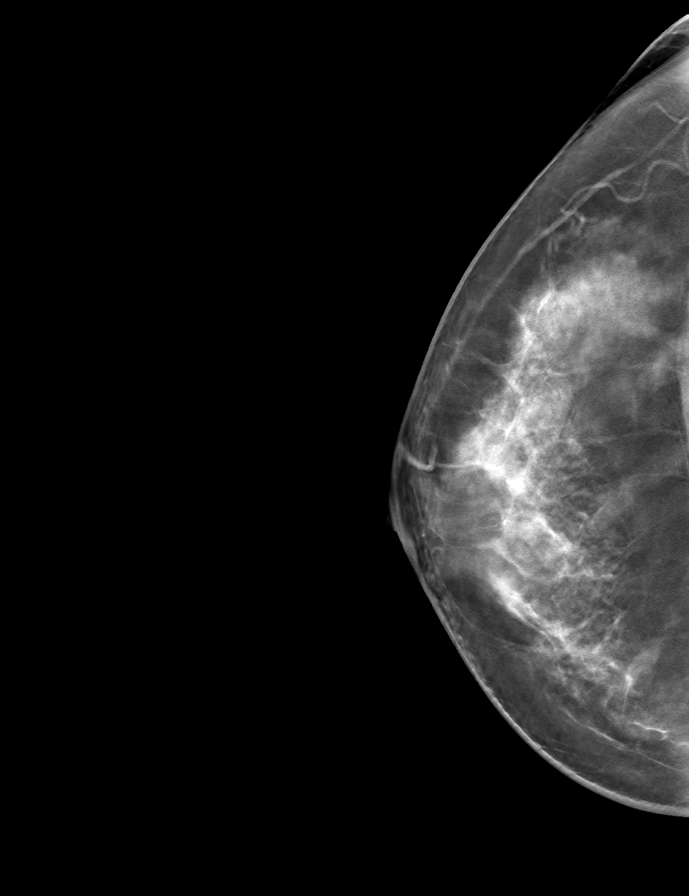

[L CC tomo · tomo slice 38/75.0]
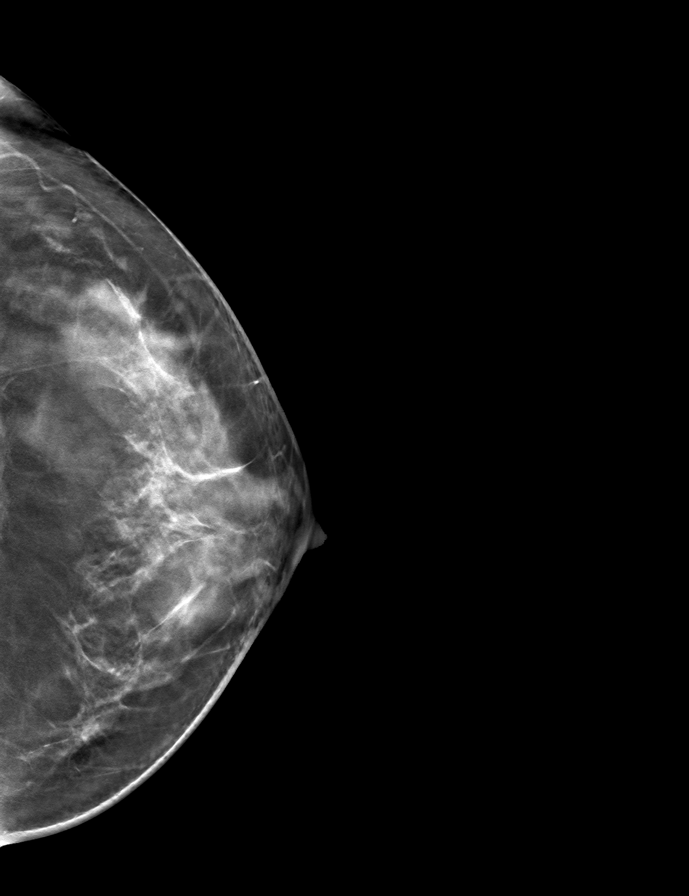

[R MLO tomo · tomo slice 39/77.0]
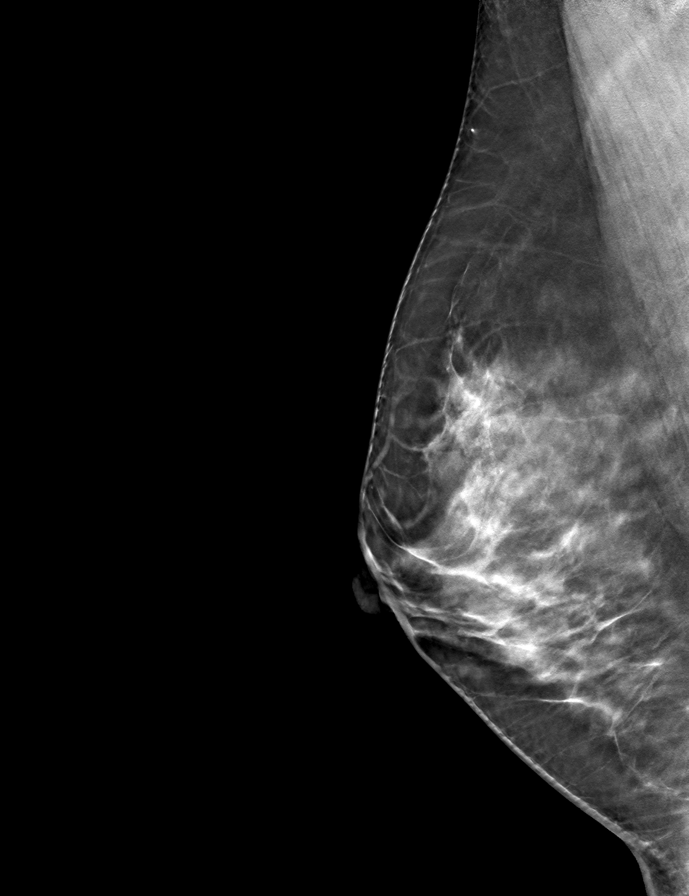

[8 of 24 positions shown; findings below may reference images not displayed]

ACR Breast Density Category c: The breast tissue is heterogeneously
dense, which may obscure small masses.
FINDINGS: Within the outer left breast middle depth there is a small oval
mass. No additional masses, calcifications or distortion identified
within either breast.

On physical exam, there is a soft palpable mass within the left
axilla. No suspicious mass palpated within the outer left breast.

Targeted ultrasound is performed, showing a 1.0 x 0.3 x 0.5 cm oval
circumscribed hypoechoic mass left breast 3:30 o'clock 3 cm from the
nipple. There is an adjacent 3 x 3 x 3 mm oval circumscribed
hypoechoic mass left breast 3 o'clock position 4 cm from nipple. No
left axillary adenopathy.
IMPRESSION: There are 2 adjacent probably benign left breast masses at the 3
o'clock and 3:30 o'clock position favored to represent complicated
cysts.

No suspicious abnormality within the left axilla at the site of
pain.

RECOMMENDATION:
Continued clinical evaluation for left axillary palpable area of
concern with associated tenderness.

Left breast diagnostic mammogram and ultrasound in 6 months to
reassess the probably benign left breast masses favored to represent
fibrocystic changes.

I have discussed the findings and recommendations with the patient.
If applicable, a reminder letter will be sent to the patient
regarding the next appointment.

BI-RADS CATEGORY  3: Probably benign.

## 2023-07-16 DIAGNOSIS — R7303 Prediabetes: Secondary | ICD-10-CM | POA: Insufficient documentation

## 2023-08-12 DIAGNOSIS — M542 Cervicalgia: Secondary | ICD-10-CM | POA: Insufficient documentation

## 2023-08-12 DIAGNOSIS — S161XXA Strain of muscle, fascia and tendon at neck level, initial encounter: Secondary | ICD-10-CM | POA: Insufficient documentation

## 2023-10-16 DIAGNOSIS — M5412 Radiculopathy, cervical region: Secondary | ICD-10-CM | POA: Insufficient documentation

## 2023-12-08 ENCOUNTER — Ambulatory Visit
Admission: EM | Admit: 2023-12-08 | Discharge: 2023-12-08 | Disposition: A | Discharge status: Home / Self Care | Attending: Emergency Medicine | Admitting: Emergency Medicine

## 2023-12-08 DIAGNOSIS — M5010 Cervical disc disorder with radiculopathy, unspecified cervical region: Secondary | ICD-10-CM | POA: Diagnosis not present

## 2023-12-08 MED ORDER — METHYLPREDNISOLONE 4 MG PO TBPK: ORAL_TABLET | Freq: Every day | ORAL | 0 refills | Status: DC

## 2023-12-08 MED ORDER — KETOROLAC TROMETHAMINE 30 MG/ML IJ SOLN
30.0000 mg | Freq: Once | INTRAMUSCULAR | Status: AC
  Administered 2023-12-08: 30 mg via INTRAMUSCULAR

## 2023-12-08 MED ORDER — TIZANIDINE HCL 4 MG PO TABS: 4.0000 mg | ORAL_TABLET | Freq: Three times a day (TID) | ORAL | 0 refills | Status: DC | PRN

## 2023-12-08 MED ORDER — PANTOPRAZOLE SODIUM 20 MG PO TBEC: 20.0000 mg | DELAYED_RELEASE_TABLET | Freq: Every day | ORAL | 0 refills | Status: DC

## 2023-12-08 MED ORDER — NAPROXEN 500 MG PO TABS: 500.0000 mg | ORAL_TABLET | Freq: Two times a day (BID) | ORAL | 0 refills | Status: DC

## 2023-12-08 MED ORDER — ACETAMINOPHEN 325 MG PO TABS
975.0000 mg | ORAL_TABLET | Freq: Once | ORAL | Status: AC
  Administered 2023-12-08: 975 mg via ORAL

## 2023-12-08 NOTE — ED Provider Notes (Signed)
 HPI  SUBJECTIVE:  Katherine Shea is a right-handed 38 y.o. female who reports sharp, electric pain starting in her neck, radiating down her right arm and in her upper thoracic region after injuring it back in October pushing and pulling a heavy object.  States the pain never resolved after the original injury.  It is gotten worse in the past 10 days, becoming more frequent.  She denies reinjury, recent trauma.  She reports numbness/tingling/weakness in her arm, limited motion of the shoulder due to pain.  She states the pain keeps her up at night.  She has tried Tylenol, Aleve , ibuprofen, heat and ice, and some unknown medications including a muscle relaxant from orthopedics.  The NSAIDs and ice help.  Symptoms are worse with lifting heavy objects.  She has been formally evaluated by orthopedics, had an MRI of the neck and T-spine which showed inflammation C3-C5 per patient.  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  She has been assigned a 5% permanent partial impairment rating from orthopedics.  She had a work note written on 5/16 outlining her restrictions.  She has a past medical history of IBS, cervical radiculopathy, chronic right shoulder pain, migraine.  PCP: [facility].  Orthopedics: [facility]  All history obtained through video interpreter [place]  Past Medical History:  Diagnosis Date   Allergy    IBS (irritable bowel syndrome)     Past Surgical History:  Procedure Laterality Date   CESAREAN SECTION  2013   TUBAL LIGATION  2013    Family History  Problem Relation Age of Onset   Hypertension Mother    Asthma Brother    Asthma Daughter    Brain cancer Maternal Grandmother    Prostate cancer Maternal Grandfather     Social History   Tobacco Use   Smoking status: Never   Smokeless tobacco: Never  Vaping Use   Vaping status: Never Used  Substance Use Topics    Alcohol use: Never   Drug use: Never    No current facility-administered medications for this encounter.  Current Outpatient Medications:    escitalopram (LEXAPRO) 10 MG tablet, 10 mg, Disp: , Rfl:    escitalopram (LEXAPRO) 20 MG tablet, Take 20 mg by mouth once daily, Disp: , Rfl:    ferrous sulfate 325 (65 FE) MG tablet, Take 1 tablet (325 mg total) by mouth daily with breakfast for 180 days, Disp: , Rfl:    methylPREDNISolone (MEDROL DOSEPAK) 4 MG TBPK tablet, Take by mouth daily. Follow package instructions, Disp: 21 tablet, Rfl: 0   naproxen  (NAPROSYN ) 500 MG tablet, Take 1 tablet (500 mg total) by mouth 2 (two) times daily., Disp: 20 tablet, Rfl: 0   pantoprazole (PROTONIX) 20 MG tablet, Take 1 tablet (20 mg total) by mouth daily for 7 days., Disp: 7 tablet, Rfl: 0   tiZANidine (ZANAFLEX) 4 MG tablet, Take 1 tablet (4 mg total) by mouth every 8 (eight) hours as needed for muscle spasms., Disp: 30 tablet, Rfl: 0   polyethylene glycol powder (GLYCOLAX/MIRALAX) 17 GM/SCOOP powder, Take 17 g by mouth in the morning and at bedtime., Disp: , Rfl:    TRULANCE 3 MG TABS, Take 1 tablet by mouth daily., Disp: , Rfl:   No Known Allergies   ROS  As noted in HPI.   Physical Exam  BP 138/88 (BP Location: Left Arm)   Pulse 64   Temp 99.1 F (37.3 C) (Oral)  Resp 16   Ht 5' 1.42 (1.56 m)   Wt 57.6 kg   LMP 12/08/2023 (Exact Date)   SpO2 98%   BMI 23.67 kg/m   Constitutional: Well developed, well nourished, appears uncomfortable Eyes:  EOMI, conjunctiva normal bilaterally HENT: Normocephalic, atraumatic,mucus membranes moist Respiratory: Normal inspiratory effort Cardiovascular: Normal rate GI: nondistended. skin: No rash, skin intact Musculoskeletal: Diffuse C-spine tenderness.  R shoulder with ROM somewhat limited, Drop test painful but negative,  clavicle NT, A/C joint  tender, scapula NT, proximal humerus tender, trapezius  tender, shoulder joint tender, Motor strength  decreased at shoulder due to pain, Sensation intact LT over deltoid region, distal NVI with hand having intact sensation and strength in the median, radial, and ulnar nerve distribution.   Pain  with internal rotation, pain  with external rotation,  negative tenderness in bicipital groove, positive empty can test,  positive liftoff test.  RP 2+. Neurologic: Alert & oriented x 3, no focal neuro deficits Psychiatric: Speech and behavior appropriate   ED Course   Medications  acetaminophen (TYLENOL) tablet 975 mg (975 mg Oral Given 12/08/23 1643)  ketorolac (TORADOL) 30 MG/ML injection 30 mg (30 mg Intramuscular Given 12/08/23 1643)    No orders of the defined types were placed in this encounter.   No results found for this or any previous visit (from the past 24 hours). No results found.  ED Clinical Impression  1. Cervical disc disorder with radiculopathy of cervical region     ED Assessment/Plan     Outside records reviewed.  As noted in HPI.  Patient presents with a acute worsening of a chronic problem.  She has already had a C-spine MRI per patient, so in the absence of recurrent trauma, I believe that plain films would be low yield.  Will give Tylenol 975 mg p.o. and Toradol 30 mg IM here.  Will send home with Medrol Dosepak, 6 days of Protonix, Salonpas lidocaine  patches, Zanaflex, Naprosyn /Tylenol.  She will need to follow-up with [facility].  Work note for several days  Using the video interpreter, discussed MDM, treatment plan, and plan for follow-up with patient.  patient agrees with plan.   Spent 50 minutes in the care of this patient.  Meds ordered this encounter  Medications   acetaminophen (TYLENOL) tablet 975 mg   ketorolac (TORADOL) 30 MG/ML injection 30 mg   methylPREDNISolone (MEDROL DOSEPAK) 4 MG TBPK tablet    Sig: Take by mouth daily. Follow package instructions    Dispense:  21 tablet    Refill:  0   naproxen  (NAPROSYN ) 500 MG tablet    Sig: Take 1  tablet (500 mg total) by mouth 2 (two) times daily.    Dispense:  20 tablet    Refill:  0   tiZANidine (ZANAFLEX) 4 MG tablet    Sig: Take 1 tablet (4 mg total) by mouth every 8 (eight) hours as needed for muscle spasms.    Dispense:  30 tablet    Refill:  0   pantoprazole (PROTONIX) 20 MG tablet    Sig: Take 1 tablet (20 mg total) by mouth daily for 7 days.    Dispense:  7 tablet    Refill:  0    *This clinic note was created using Scientist, clinical (histocompatibility and immunogenetics). Therefore, there may be occasional mistakes despite careful proofreading.  ?     Van Knee, MD 12/08/23 [phone]

## 2023-12-08 NOTE — ED Triage Notes (Signed)
 Pt c/o R arm & back pain x8 days. States 1 yr ago had a work injury & was seen by PT which helped some but pain flare up 1 wk ago. Has tried OTC meds w/o relief.

## 2023-12-08 NOTE — Discharge Instructions (Signed)
 Take 500 mg of Naprosyn  with 1000 mg of Tylenol twice a day.  May take an additional 1000 mg of Tylenol 1 more time a day.  Medrol Dosepak are steroids which will help with pain and inflammation.  Take the Protonix while you are taking the Medrol Dosepak.  Salonpas 4% lidocaine  patches are available over-the-counter.  Put this on your neck.  This may also help with pain.  Zanaflex is a nonsedating muscle relaxant, and will help with the muscle spasms.  Follow-up with [facility] as soon as you possibly can for reevaluation.  Tome 500 mg de Naprosyn  con 1000 mg de Tylenol dos veces al da. Puede tomar 1000 mg adicionales de Tylenol una vez ms al da. Medrol Dosepak son esteroides que alivian el dolor y la inflamacin. Tome Protonix mientras toma Medrol Dosepak. Los parches de [employer] al 4% estn disponibles sin receta. Colquelos en el cuello. Esto tambin puede aliviar el dolor. Zanaflex es un relajante muscular no sedante que ayuda con los espasmos musculares. Acuda a [facility] lo antes posible para una reevaluacin.

## 2024-01-26 ENCOUNTER — Ambulatory Visit (INDEPENDENT_AMBULATORY_CARE_PROVIDER_SITE_OTHER): Admitting: Family Medicine

## 2024-01-26 ENCOUNTER — Encounter: Payer: Self-pay | Admitting: Family Medicine

## 2024-01-26 VITALS — BP 100/64 | HR 66 | Ht 61.42 in | Wt 140.2 lb

## 2024-01-26 DIAGNOSIS — K5909 Other constipation: Secondary | ICD-10-CM | POA: Diagnosis not present

## 2024-01-26 DIAGNOSIS — K219 Gastro-esophageal reflux disease without esophagitis: Secondary | ICD-10-CM | POA: Insufficient documentation

## 2024-01-26 DIAGNOSIS — R7303 Prediabetes: Secondary | ICD-10-CM

## 2024-01-26 DIAGNOSIS — R7309 Other abnormal glucose: Secondary | ICD-10-CM

## 2024-01-26 DIAGNOSIS — Z136 Encounter for screening for cardiovascular disorders: Secondary | ICD-10-CM

## 2024-01-26 DIAGNOSIS — H9201 Otalgia, right ear: Secondary | ICD-10-CM | POA: Diagnosis not present

## 2024-01-26 DIAGNOSIS — Z0000 Encounter for general adult medical examination without abnormal findings: Secondary | ICD-10-CM

## 2024-01-26 DIAGNOSIS — Z1159 Encounter for screening for other viral diseases: Secondary | ICD-10-CM

## 2024-01-26 MED ORDER — PANTOPRAZOLE SODIUM 40 MG PO TBEC: 40.0000 mg | DELAYED_RELEASE_TABLET | Freq: Every day | ORAL | 0 refills | Status: DC

## 2024-01-26 NOTE — Assessment & Plan Note (Signed)
 Otolaryngologic symptoms - Ear pain with intermittent discharge and scabbing - Pain radiates from ear down to throat - Symptoms last approximately eight days before resolving, then recur  Ear pain likely referred from neck muscle strain Intermittent ear pain with associated neck pain likely referred from neck muscle strain. No evidence of ear infection or lymphadenopathy.

## 2024-01-26 NOTE — Assessment & Plan Note (Signed)
 Constipation and bowel dysfunction - Severe constipation for several years, with bowel movements as infrequent as once every 8 to 15 days - Colonoscopy performed due to hemorrhoids resulting from constipation - Initially treated with Trulance, which caused severe diarrhea - Switched to Miralax, taken once in the morning and once at night, with instructions to double the dose at night if constipation persists - Initial improvement with Miralax, but efficacy has diminished over time - No follow-up for constipation in over a year - Pelvic floor therapy completed, with massage techniques providing occasional relief but also causing increased gas - Ongoing frustration with persistent symptoms and impact on quality of life  Physical Exam HEENT: Tympanic membranes, canals, oropharynx, and nasopharynx benign. NECK: No cervical lymphadenopathy. CHEST: Lungs clear to auscultation bilaterally. CARDIOVASCULAR: Regular rate and rhythm, normal heart sounds. ABDOMEN: Normoactive bowel sounds. Epigastric region tender. Tenderness in the lower right quadrant.  No hepatosplenomegaly, no rebound.  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 Miralax initially effective, now less so. Symptoms include infrequent bowel movements and abdominal discomfort. Possible IBS-C diagnosis. Prolonged symptoms warrant GI specialist evaluation. - Refer to GI specialist for further evaluation and management. - Instruct to follow a stepwise approach for constipation management, starting with step one and progressing to step four if needed. - Advise to use simethicone (Gas-X) for gas relief as needed. - Educate on attending medical appointments and communicating treatment efficacy and side effects.

## 2024-01-26 NOTE — Assessment & Plan Note (Signed)
 Gastrointestinal symptoms - Episodes of vomiting shortly after eating, particularly after dinner  Epigastric pain likely due to gastric acid Epigastric pain with burning sensation likely due to excess gastric acid. - Prescribe pantoprazole  40 mg to reduce gastric acid, to be taken daily on an empty stomach. - Referral to GI placed.

## 2024-01-26 NOTE — Progress Notes (Signed)
 Primary Care / Sports Medicine Office Visit  Patient Information:  Patient ID: Katherine Shea, female DOB: [birth year] Age: 38 y.o. MRN: [id number]   Katherine Shea is a pleasant 38 y.o. female presenting with the following:  Chief Complaint  Patient presents with   Diabetes    Interpreter [id number]. Patient presents today for a check upon her diabetes. She is not on any medication for her diabetes. She has been feeling nauseous lately and unable to keep food down. She has also been dizzy.     Vitals:   01/26/24 1317  BP: 100/64  Pulse: 66  SpO2: 99%   Vitals:   01/26/24 1317  Weight: 140 lb 3.2 oz (63.6 kg)  Height: 5' 1.42 (1.56 m)   Body mass index is 26.13 kg/m.  No results found.   Independent interpretation of notes and tests performed by another provider:   None  Procedures performed:   None  Pertinent History, Exam, Impression, and Recommendations:   Problem List Items Addressed This Visit     Chronic constipation - Primary   Constipation and bowel dysfunction - Severe constipation for several years, with bowel movements as infrequent as once every 8 to 15 days - Colonoscopy performed due to hemorrhoids resulting from constipation - Initially treated with Trulance, which caused severe diarrhea - Switched to Miralax, taken once in the morning and once at night, with instructions to double the dose at night if constipation persists - Initial improvement with Miralax, but efficacy has diminished over time - No follow-up for constipation in over a year - Pelvic floor therapy completed, with massage techniques providing occasional relief but also causing increased gas - Ongoing frustration with persistent symptoms and impact on quality of life  Physical Exam HEENT: Tympanic membranes, canals, oropharynx, and nasopharynx benign. NECK: No cervical lymphadenopathy. CHEST: Lungs clear to auscultation bilaterally. CARDIOVASCULAR: Regular rate  and rhythm, normal heart sounds. ABDOMEN: Normoactive bowel sounds. Epigastric region tender. Tenderness in the lower right quadrant.  No hepatosplenomegaly, no rebound.  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 Miralax initially effective, now less so. Symptoms include infrequent bowel movements and abdominal discomfort. Possible IBS-C diagnosis. Prolonged symptoms warrant GI specialist evaluation. - Refer to GI specialist for further evaluation and management. - Instruct to follow a stepwise approach for constipation management, starting with step one and progressing to step four if needed. - Advise to use simethicone (Gas-X) for gas relief as needed. - Educate on attending medical appointments and communicating treatment efficacy and side effects.       Relevant Orders   Comprehensive metabolic panel with GFR   TSH   Amylase   Lipase   Ambulatory referral to Gastroenterology   Ear pain, right   Otolaryngologic symptoms - Ear pain with intermittent discharge and scabbing - Pain radiates from ear down to throat - Symptoms last approximately eight days before resolving, then recur  Ear pain likely referred from neck muscle strain Intermittent ear pain with associated neck pain likely referred from neck muscle strain. No evidence of ear infection or lymphadenopathy.      Gastroesophageal reflux disease   Gastrointestinal symptoms - Episodes of vomiting shortly after eating, particularly after dinner  Epigastric pain likely due to gastric acid Epigastric pain with burning sensation likely due to excess gastric acid. - Prescribe pantoprazole  40 mg to reduce gastric acid, to be taken daily on an empty  stomach. - Referral to GI placed.      Relevant Medications   pantoprazole  (PROTONIX ) 40 MG tablet   Other Relevant Orders   Ambulatory referral to  Gastroenterology   Prediabetes   Prediabetes Prediabetes with hemoglobin A1c at prediabetic level. Family history of diabetes and cancer noted. - Monitor blood glucose levels and maintain dietary modifications to prevent progression to diabetes.      Other Visit Diagnoses       Healthcare maintenance       Relevant Orders   CBC   Comprehensive metabolic panel with GFR   Hemoglobin A1c   Hepatitis C antibody   HIV Antibody (routine testing w rflx)   Lipid panel   TSH   Amylase   Lipase     Screening for cardiovascular condition       Relevant Orders   Lipid panel     Abnormal glucose       Relevant Orders   Hemoglobin A1c     Screening for viral disease       Relevant Orders   Hepatitis C antibody   HIV Antibody (routine testing w rflx)      A total of 45 minutes was spent on the date of service, 01/26/2024, encompassing both face-to-face and non-face-to-face time. This included review of prior records and imaging (e.g., MRI and/or radiographs), medical chart review, information gathering, documentation, care coordination with clinic staff, discussion and counseling with the patient regarding clinical findings and treatment options, and planning for follow-up and next steps in management.   Orders & Medications Medications:  Meds ordered this encounter  Medications   pantoprazole  (PROTONIX ) 40 MG tablet    Sig: Take 1 tablet (40 mg total) by mouth daily.    Dispense:  30 tablet    Refill:  0   Orders Placed This Encounter  Procedures   CBC   Comprehensive metabolic panel with GFR   Hemoglobin A1c   Hepatitis C antibody   HIV Antibody (routine testing w rflx)   Lipid panel   TSH   Amylase   Lipase   Ambulatory referral to Gastroenterology     Return in about 4 weeks (around 02/23/2024).     Selinda JINNY Ku, MD, [facility]   Primary Care Sports Medicine Primary Care and Sports Medicine at [facility]

## 2024-01-26 NOTE — Patient Instructions (Addendum)
 VISIT SUMMARY:  During your visit, we discussed your ongoing gastrointestinal issues, including severe constipation, weight fluctuations, and related symptoms. We also addressed your ear pain and prediabetic state.  YOUR PLAN:  CHRONIC SEVERE CONSTIPATION AND IRRITABLE BOWEL SYNDROME WITH CONSTIPATION (IBS-C): You have severe constipation with infrequent bowel movements and abdominal discomfort, possibly due to IBS-C. -You will be referred to a GI specialist for further evaluation and management. -Follow a stepwise approach for managing constipation, starting with step one and progressing to step four if needed. -Use simethicone (Gas-X) for gas relief as needed. -Attend medical appointments and communicate the effectiveness and side effects of treatments.  ABDOMINAL PAIN, RIGHT LOWER QUADRANT, ASSOCIATED WITH CONSTIPATION: Your right lower quadrant abdominal pain is likely due to stool accumulation at the bowel's 90-degree turn.  GAS AND BLOATING ASSOCIATED WITH CONSTIPATION: Your gas and bloating are likely secondary to constipation and potential IBS-C. -Use simethicone (Gas-X) for gas relief as needed.  ABNORMAL WEIGHT GAIN ASSOCIATED WITH CONSTIPATION: Your weight gain may be related to stool retention due to chronic constipation.  EPIGASTRIC PAIN LIKELY DUE TO GASTRIC ACID: Your epigastric pain with a burning sensation is likely due to excess gastric acid. -Take pantoprazole  daily on an empty stomach to reduce gastric acid.  EAR PAIN LIKELY REFERRED FROM NECK MUSCLE STRAIN: Your intermittent ear pain with associated neck pain is likely referred from neck muscle strain.  PREDIABETES: You have prediabetes with a hemoglobin A1c at a prediabetic level. -Monitor your blood glucose levels. -Maintain dietary modifications to prevent progression to diabetes.  RESUMEN DE LA VISITA Durante su visita, hablamos sobre sus problemas gastrointestinales continuos, incluyendo estreimiento severo,  fluctuaciones de peso y sntomas relacionados. Tambin tratamos su dolor de odo y su estado de prediabetes.  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  Ser referido(a) a un especialista en gastroenterologa para una evaluacin y manejo adicionales.  Siga un enfoque paso a paso para manejar el estreimiento, comenzando con el paso uno y avanzando hasta el paso cuatro si es necesario.  Use simeticona (Gas-X) segn necesidad para aliviar los gases.  Asista a sus citas mdicas y comunique la eficacia y los efectos secundarios de los tratamientos.  DOLOR ABDOMINAL EN CUADRANTE INFERIOR DERECHO, ASOCIADO AL ESTREIMIENTO: Su dolor abdominal en el cuadrante inferior derecho probablemente se deba a acumulacin de heces en la zona donde el intestino hace un giro de 90 grados.  GASES E HINCHAZN ASOCIADOS AL ESTREIMIENTO: Sus gases e hinchazn probablemente son secundarios al estreimiento y posible SII-E.  Use simeticona (Gas-X) segn necesidad para aliviar los gases.  AUMENTO DE PESO ANORMAL ASOCIADO AL ESTREIMIENTO: Su aumento de peso puede estar relacionado con la retencin de heces debido al estreimiento crnico.  DOLOR EPIGSTRICO PROBABLEMENTE POR CIDO GSTRICO: Su dolor epigstrico con sensacin de ardor probablemente se deba a exceso de cido gstrico.  Tome pantoprazol diariamente con el estmago vaco para reducir el cido gstrico.  DOLOR DE ODO PROBABLEMENTE REFERIDO POR TENSIN MUSCULAR EN EL CUELLO: Su dolor de odo intermitente, acompaado de Surveyor, quantity cuello, probablemente es referido desde la tensin muscular en esa zona.  PREDIABETES: Usted tiene prediabetes, con hemoglobina A1c en nivel de prediabetes.  Controle sus niveles de glucosa en [place].  Mantenga las  modificaciones dietticas para prevenir la progresin a diabetes.

## 2024-01-26 NOTE — Assessment & Plan Note (Signed)
 Prediabetes Prediabetes with hemoglobin A1c at prediabetic level. Family history of diabetes and cancer noted. - Monitor blood glucose levels and maintain dietary modifications to prevent progression to diabetes.

## 2024-01-27 ENCOUNTER — Ambulatory Visit: Payer: Self-pay | Admitting: Family Medicine

## 2024-01-27 ENCOUNTER — Other Ambulatory Visit: Payer: Self-pay | Admitting: Family Medicine

## 2024-01-27 LAB — LIPID PANEL
Chol/HDL Ratio: 6 ratio — ABNORMAL HIGH (ref 0.0–4.4)
Cholesterol, Total: 221 mg/dL — ABNORMAL HIGH (ref 100–199)
HDL: 37 mg/dL — ABNORMAL LOW (ref >39)
LDL Chol Calc (NIH): 118 mg/dL — ABNORMAL HIGH (ref 0–99)
Triglycerides: 379 mg/dL — ABNORMAL HIGH (ref 0–149)
VLDL Cholesterol Cal: 66 mg/dL — ABNORMAL HIGH (ref 5–40)

## 2024-01-27 LAB — CBC
Hematocrit: 39.4 % (ref 34.0–46.6)
Hemoglobin: 11.6 g/dL (ref 11.1–15.9)
MCH: 22.2 pg — ABNORMAL LOW (ref 26.6–33.0)
MCHC: 29.4 g/dL — ABNORMAL LOW (ref 31.5–35.7)
MCV: 76 fL — ABNORMAL LOW (ref 79–97)
Platelets: 274 x10E3/uL (ref 150–450)
RBC: 5.22 x10E6/uL (ref 3.77–5.28)
RDW: 14.9 % (ref 11.7–15.4)
WBC: 7.2 x10E3/uL (ref 3.4–10.8)

## 2024-01-27 LAB — COMPREHENSIVE METABOLIC PANEL WITH GFR
ALT: 12 IU/L (ref 0–32)
AST: 13 IU/L (ref 0–40)
Albumin: 4.5 g/dL (ref 3.9–4.9)
Alkaline Phosphatase: 74 IU/L (ref 44–121)
BUN/Creatinine Ratio: 19 (ref 9–23)
BUN: 12 mg/dL (ref 6–20)
Bilirubin Total: 0.4 mg/dL (ref 0.0–1.2)
CO2: 20 mmol/L (ref 20–29)
Calcium: 9.1 mg/dL (ref 8.7–10.2)
Chloride: 102 mmol/L (ref 96–106)
Creatinine, Ser: 0.62 mg/dL (ref 0.57–1.00)
Globulin, Total: 2.7 g/dL (ref 1.5–4.5)
Glucose: 86 mg/dL (ref 70–99)
Potassium: 4.1 mmol/L (ref 3.5–5.2)
Sodium: 136 mmol/L (ref 134–144)
Total Protein: 7.2 g/dL (ref 6.0–8.5)
eGFR: 116 mL/min/1.73 (ref >59)

## 2024-01-27 LAB — SPECIMEN STATUS REPORT

## 2024-01-27 LAB — HEMOGLOBIN A1C
Est. average glucose Bld gHb Est-mCnc: 117 mg/dL
Hgb A1c MFr Bld: 5.7 % — ABNORMAL HIGH (ref 4.8–5.6)

## 2024-01-27 LAB — AMYLASE: Amylase: 42 U/L (ref 31–110)

## 2024-01-27 LAB — LIPASE: Lipase: 45 U/L (ref 14–72)

## 2024-01-27 LAB — HEPATITIS C ANTIBODY: Hep C Virus Ab: NONREACTIVE

## 2024-01-27 LAB — TSH: TSH: 1.94 u[IU]/mL (ref 0.450–4.500)

## 2024-01-27 LAB — HIV ANTIBODY (ROUTINE TESTING W REFLEX): HIV Screen 4th Generation wRfx: NONREACTIVE

## 2024-01-27 MED ORDER — ATORVASTATIN CALCIUM 10 MG PO TABS: 10.0000 mg | ORAL_TABLET | Freq: Every day | ORAL | 0 refills | Status: DC

## 2024-01-27 NOTE — Progress Notes (Signed)
 Please contact (Spanish speaking) patient and let her know the following:  [name] your lab results came back significant for the following:  - Blood count consistent with iron deficiency, I have ordered an add-on iron panel and will reach out with results once available, in the meantime she needs to start an OTC iron supplement as follows: Recommended Iron Supplement and Dosing  Form: Ferrous sulfate (standard option): 325 mg tablet (~65 mg elemental iron). Ferrous gluconate (milder on the stomach): 324 mg tablet (~38 mg elemental iron). Ferrous fumarate: 324 mg tablet (~106 mg elemental iron).  Dosage: Adults: 65-100 mg of elemental iron every other day. Slow-release formulations can reduce side effects (e.g., constipation or nausea) but may have slightly lower absorption.  Instructions for Optimal Absorption: Take on an empty stomach (1 hour before or 2 hours after meals) to maximize absorption. Vitamin C (e.g., 500 mg) enhances iron absorption; consider taking it with orange juice or a vitamin C tablet. Avoid calcium , tea, or coffee within 2 hours of taking iron, as these can interfere with absorption.  - A1c at the lowest limit of prediabetes, no medication is needed but aggressive lifestyle changes needed to bring this back into normal range - Cholesterol is extremely, high, I have sent in a cholesterol lowering medication called a statin for her to take once daily  Your remaining labs came back normal / reassuring. Please reach out for any questions.

## 2024-02-03 ENCOUNTER — Encounter: Payer: Self-pay | Admitting: Gastroenterology

## 2024-02-10 LAB — ANEMIA PROFILE A
Iron Saturation: 4 % — CL (ref 15–55)
Iron: 19 ug/dL — ABNORMAL LOW (ref 27–159)
Total Iron Binding Capacity: 450 ug/dL (ref 250–450)
UIBC: 431 ug/dL — ABNORMAL HIGH (ref 131–425)

## 2024-02-10 LAB — SPECIMEN STATUS REPORT

## 2024-02-10 LAB — FERRITIN: Ferritin: 7 ng/mL — ABNORMAL LOW (ref 15–150)

## 2024-02-26 ENCOUNTER — Encounter: Payer: Self-pay | Admitting: Family Medicine

## 2024-02-26 ENCOUNTER — Ambulatory Visit: Admitting: Family Medicine

## 2024-02-26 ENCOUNTER — Ambulatory Visit (INDEPENDENT_AMBULATORY_CARE_PROVIDER_SITE_OTHER): Admitting: Family Medicine

## 2024-02-26 VITALS — BP 108/70 | HR 67 | Ht 61.42 in | Wt 141.6 lb

## 2024-02-26 DIAGNOSIS — E7849 Other hyperlipidemia: Secondary | ICD-10-CM | POA: Diagnosis not present

## 2024-02-26 DIAGNOSIS — R7303 Prediabetes: Secondary | ICD-10-CM | POA: Diagnosis not present

## 2024-02-26 DIAGNOSIS — D508 Other iron deficiency anemias: Secondary | ICD-10-CM | POA: Diagnosis not present

## 2024-02-26 DIAGNOSIS — K5909 Other constipation: Secondary | ICD-10-CM

## 2024-02-26 DIAGNOSIS — N926 Irregular menstruation, unspecified: Secondary | ICD-10-CM | POA: Diagnosis not present

## 2024-02-26 MED ORDER — ONDANSETRON 4 MG PO TBDP
4.0000 mg | ORAL_TABLET | Freq: Three times a day (TID) | ORAL | 0 refills | Status: AC | PRN
Start: 2024-02-26 — End: 2024-03-14

## 2024-02-29 DIAGNOSIS — E7849 Other hyperlipidemia: Secondary | ICD-10-CM | POA: Insufficient documentation

## 2024-02-29 NOTE — Assessment & Plan Note (Signed)
 Iron deficiency anemia - Severe iron deficiency anemia with iron saturation of 4% and iron level of 19 - Experienced severe vomiting after taking over-the-counter ferrous sulfate with vitamin C, resulting in an urgent care visit and missing three days of work - Vomiting resolved after discontinuing iron supplement  Iron deficiency anemia Iron levels critically low. Hemoglobin at 19, iron saturation at 4%. Likely due to gastrointestinal or menstrual blood loss. Intolerance to oral iron supplements due to vomiting. - Refer to hematology for iron infusion. - Stop oral iron supplements until hematology consultation. - Continue with gastroenterology appointment on October 13th to evaluate for gastrointestinal blood loss. - Refer to gynecology to evaluate menstrual cycle changes and potential blood loss. - Educated on importance of addressing iron deficiency.

## 2024-03-02 NOTE — Assessment & Plan Note (Signed)
 Altered bowel habits - Constipation managed initially with over-the-counter treatments - Severe diarrhea with milk of magnesia - Currently using Metamucil, which has improved regularity but not fully resolved constipation - Consumes prunes and melons to aid digestion  Constipation Chronic constipation with partial improvement from Metamucil. Milk of magnesia and Trulance caused diarrhea. Current regimen includes Metamucil and dietary changes. - Increase Metamucil dosage or add Miralax. - Continue dietary changes with increased fruit intake. - Prescribe alternative constipation medication to be picked up at [facility] in [place]. - Prescribe Zofran  for nausea as needed.

## 2024-03-02 NOTE — Assessment & Plan Note (Signed)
 Hyperlipidemia - Currently taking atorvastatin  for elevated cholesterol - Cholesterol to HDL ratio of 6.0, above the desired level for females  Hyperlipidemia Cholesterol to HDL ratio of 6.0, increasing cardiac risk. Current treatment includes atorvastatin . - Continue atorvastatin  for three months. - Reassess lipid levels with labs in three months.

## 2024-03-02 NOTE — Progress Notes (Addendum)
 Primary Care / Sports Medicine Office Visit  Patient Information:  Patient ID: Katherine Shea, female DOB: 14-Dec-1985 Age: 38 y.o. MRN: [id number]   Katherine Shea is a pleasant 39 y.o. female presenting with the following:  Chief Complaint  Patient presents with   Emesis    Interpreter Claudia # [phone]. Patient is concerned that she is vomiting everything she eats and still gaining weight. Constipation is better.    Vitals:   02/26/24 1013  BP: 108/70  Pulse: 67  SpO2: 99%   Vitals:   02/26/24 1013  Weight: 141 lb 9.6 oz (64.2 kg)  Height: 5' 1.42 (1.56 m)   Body mass index is 26.39 kg/m.  No results found.   Discussed the use of AI scribe software for clinical note transcription with the patient, who gave verbal consent to proceed.   Independent interpretation of notes and tests performed by another provider:   None  Procedures performed:   None  Pertinent History, Exam, Impression, and Recommendations:   Problem List Items Addressed This Visit     Absolute anemia - Primary   Iron deficiency anemia - Severe iron deficiency anemia with iron saturation of 4% and iron level of 19 - Experienced severe vomiting after taking over-the-counter ferrous sulfate with vitamin C, resulting in an urgent care visit and missing three days of work - Vomiting resolved after discontinuing iron supplement  Iron deficiency anemia Iron levels critically low. Hemoglobin at 19, iron saturation at 4%. Likely due to gastrointestinal or menstrual blood loss. Intolerance to oral iron supplements due to vomiting. - Refer to hematology for iron infusion. - Stop oral iron supplements until hematology consultation. - Continue with gastroenterology appointment on October 13th to evaluate for gastrointestinal blood loss. - Refer to gynecology to evaluate menstrual cycle changes and potential blood loss. - Educated on importance of addressing iron deficiency.       Relevant Orders   Ambulatory referral to Hematology / Oncology   Iron, TIBC and Ferritin Panel   CBC   Ambulatory referral to Obstetrics / Gynecology   Chronic constipation   Altered bowel habits - Constipation managed initially with over-the-counter treatments - Severe diarrhea with milk of magnesia - Currently using Metamucil, which has improved regularity but not fully resolved constipation - Consumes prunes and melons to aid digestion  Constipation Chronic constipation with partial improvement from Metamucil. Milk of magnesia and Trulance caused diarrhea. Current regimen includes Metamucil and dietary changes. - Increase Metamucil dosage or add Miralax. - Continue dietary changes with increased fruit intake. - Prescribe alternative constipation medication [facility] [place]. - Prescribe Zofran  for nausea as needed.      Other hyperlipidemia   Hyperlipidemia - Currently taking atorvastatin  for elevated cholesterol - Cholesterol to HDL ratio of 6.0, above the desired level for females  Hyperlipidemia Cholesterol to HDL ratio of 6.0, increasing cardiac risk. Current treatment includes atorvastatin . - Continue atorvastatin  for three months. - Reassess lipid levels with labs in three months.      Relevant Orders   Comprehensive Metabolic Panel (CMET)   Lipid panel   Prediabetes   Relevant Orders   Hemoglobin A1c   Other Visit Diagnoses       Menstrual irregularity       Relevant Orders   Ambulatory referral to Obstetrics / Gynecology      A total of 45 minutes was spent on the date of service, 03/02/2024, encompassing both face-to-face and non-face-to-face  time. This included review of prior records and imaging (e.g., MRI and/or radiographs), medical chart review, information gathering, documentation, care coordination with clinic staff, discussion and counseling with the patient regarding clinical findings and treatment options, and planning for follow-up  and next steps in management.   Orders & Medications Medications:  Meds ordered this encounter  Medications   ondansetron  (ZOFRAN -ODT) 4 MG disintegrating tablet    Sig: Take 1 tablet (4 mg total) by mouth every 8 (eight) hours as needed for up to 10 days for nausea or vomiting.    Dispense:  20 tablet    Refill:  0   Orders Placed This Encounter  Procedures   Iron, TIBC and Ferritin Panel   CBC   Comprehensive Metabolic Panel (CMET)   Lipid panel   Hemoglobin A1c   Ambulatory referral to Hematology / Oncology   Ambulatory referral to Obstetrics / Gynecology     Return in about 3 months (around 05/27/2024).     Selinda JINNY Ku, MD, [facility]   Primary Care Sports Medicine Primary Care and Sports Medicine at [facility]

## 2024-03-02 NOTE — Patient Instructions (Signed)
 Patient Plan  Iron Deficiency Anemia  - Stop taking oral iron supplements until you see hematology. - Attend your hematology appointment for iron infusion evaluation. - Keep your gastroenterology appointment on October 13th to check for possible gastrointestinal blood loss. - Follow up with gynecology to evaluate for menstrual blood loss. - Complete the ordered blood tests (Iron, TIBC, Ferritin, CBC).  Constipation  - Increase your Metamucil dosage or add Miralax as directed. - Continue eating more fruits like prunes and melons. - Pick up and use the prescribed alternative [facility] [place]. - Use Zofran  as needed for nausea.  Hyperlipidemia  - Continue taking atorvastatin  as prescribed. - Repeat lipid panel and labs in three months to reassess cholesterol levels.  Prediabetes  - Complete the ordered Hemoglobin A1c test.  Red flags - seek care right away if you notice:  - Severe or ongoing vomiting - Black or bloody stools - Severe abdominal pain - Dizziness, fainting, or rapid heartbeat - Any new or worsening symptoms

## 2024-03-08 NOTE — Progress Notes (Unsigned)
 Alvia Selinda PARAS, MD   No chief complaint on file.   HPI:      Ms. Katherine Shea is a 38 y.o. [phone] whose LMP was No LMP recorded., presents today for menorrhagai with IDA on 8/25 labs  Neg pap/neg HPV DNA 3/23  Patient Active Problem List   Diagnosis Date Noted   Other hyperlipidemia 02/29/2024   Gastroesophageal reflux disease 01/26/2024   Ear pain, right 01/26/2024   Cervical radiculopathy 10/16/2023   Neck pain 08/12/2023   Strain of neck muscle 08/12/2023   Prediabetes 07/16/2023   Absolute anemia 11/21/2022   Anxiety and depression 10/30/2022   Chronic right shoulder pain 10/30/2022   Migraine with aura and without status migrainosus, not intractable 10/30/2022   Osteoarthritis of multiple joints 10/30/2022   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   Left anterior knee pain 11/09/2020   History of pre-eclampsia 08/14/2020   Lower abdominal pain 08/14/2020   Migraine without aura and without status migrainosus, not intractable 08/07/2019   Chronic constipation 02/08/2019    Past Surgical History:  Procedure Laterality Date   CESAREAN SECTION  2013   TUBAL LIGATION  2013    Family History  Problem Relation Age of Onset   Hypertension Mother    Asthma Brother    Asthma Daughter    Brain cancer Maternal Grandmother    Prostate cancer Maternal Grandfather     Social History   Socioeconomic History   Marital status: Married    Spouse name: Aloysius Gab   Number of children: 2   Years of education: 16   Highest education level: Bachelor's degree (e.g., BA, AB, BS)  Occupational History   Not on file  Tobacco Use   Smoking status: Never   Smokeless tobacco: Never  Vaping Use   Vaping status: Never Used  Substance and Sexual Activity   Alcohol use: Never   Drug use: Never   Sexual activity: Yes    Birth control/protection: Surgical  Other Topics Concern   Not on file  Social History Narrative   Not on file   Social Drivers of  Health   Financial Resource Strain: Low Risk  (10/30/2022)   Received from [facility]   Overall Financial Resource Strain (CARDIA)    Difficulty of Paying Living Expenses: Not very hard  Food Insecurity: Unknown (10/30/2022)   Received from [facility]   Hunger Vital Sign    Within the past 12 months, you worried that your food would run out before you got the money to buy more.: Never true    Ran Out of Food in the Last Year: Not on file  Transportation Needs: Unknown (10/30/2022)   Received from [facility] - Transportation    In the past 12 months, has lack of transportation kept you from medical appointments or from getting medications?: No    Lack of Transportation (Non-Medical): Not on file  Physical Activity: Not on file  Stress: Not on file  Social Connections: Not on file  Intimate Partner Violence: Not on file    Outpatient Medications Prior to Visit  Medication Sig Dispense Refill   atorvastatin  (LIPITOR) 10 MG tablet Take 1 tablet (10 mg total) by mouth daily. 90 tablet 0   ferrous sulfate 325 (65 FE) MG tablet Take 1 tablet (325 mg total) by mouth daily with breakfast for 180 days (Patient not taking: Reported on 02/26/2024)     pantoprazole  (PROTONIX ) 40  MG tablet Take 1 tablet (40 mg total) by mouth daily. 30 tablet 0   No facility-administered medications prior to visit.      ROS:  Review of Systems BREAST: No symptoms   OBJECTIVE:   Vitals:  There were no vitals taken for this visit.  Physical Exam  Results: No results found for this or any previous visit (from the past 24 hours).   Assessment/Plan: No diagnosis found.    No orders of the defined types were placed in this encounter.     No follow-ups on file.  Mouhamad Teed B. Loleta Frommelt, PA-C 03/08/2024 12:39 PM

## 2024-03-10 ENCOUNTER — Ambulatory Visit (INDEPENDENT_AMBULATORY_CARE_PROVIDER_SITE_OTHER): Admitting: Obstetrics and Gynecology

## 2024-03-10 ENCOUNTER — Encounter: Payer: Self-pay | Admitting: Obstetrics and Gynecology

## 2024-03-10 ENCOUNTER — Other Ambulatory Visit (HOSPITAL_COMMUNITY)
Admission: RE | Admit: 2024-03-10 | Discharge: 2024-03-10 | Disposition: A | Source: Ambulatory Visit | Attending: Obstetrics and Gynecology | Admitting: Obstetrics and Gynecology

## 2024-03-10 VITALS — BP 144/65 | HR 56 | Ht 62.0 in | Wt 141.0 lb

## 2024-03-10 DIAGNOSIS — N644 Mastodynia: Secondary | ICD-10-CM

## 2024-03-10 DIAGNOSIS — Z1151 Encounter for screening for human papillomavirus (HPV): Secondary | ICD-10-CM

## 2024-03-10 DIAGNOSIS — R350 Frequency of micturition: Secondary | ICD-10-CM

## 2024-03-10 DIAGNOSIS — Z124 Encounter for screening for malignant neoplasm of cervix: Secondary | ICD-10-CM

## 2024-03-10 DIAGNOSIS — N920 Excessive and frequent menstruation with regular cycle: Secondary | ICD-10-CM

## 2024-03-10 DIAGNOSIS — R2232 Localized swelling, mass and lump, left upper limb: Secondary | ICD-10-CM | POA: Diagnosis not present

## 2024-03-10 DIAGNOSIS — R928 Other abnormal and inconclusive findings on diagnostic imaging of breast: Secondary | ICD-10-CM

## 2024-03-10 DIAGNOSIS — D500 Iron deficiency anemia secondary to blood loss (chronic): Secondary | ICD-10-CM

## 2024-03-10 DIAGNOSIS — Z1231 Encounter for screening mammogram for malignant neoplasm of breast: Secondary | ICD-10-CM

## 2024-03-10 NOTE — Patient Instructions (Signed)
 I value your feedback and you entrusting [place] with your care. If you get a [place] patient survey, I would appreciate you taking the time to let us know about your experience today. Thank you! ? ? ?

## 2024-03-14 ENCOUNTER — Inpatient Hospital Stay: Attending: Oncology | Admitting: Oncology

## 2024-03-14 ENCOUNTER — Ambulatory Visit: Payer: Self-pay | Admitting: Oncology

## 2024-03-14 ENCOUNTER — Encounter: Payer: Self-pay | Admitting: Oncology

## 2024-03-14 ENCOUNTER — Inpatient Hospital Stay

## 2024-03-14 VITALS — BP 116/65 | HR 60 | Temp 97.1°F | Resp 18 | Wt 143.4 lb

## 2024-03-14 DIAGNOSIS — Z808 Family history of malignant neoplasm of other organs or systems: Secondary | ICD-10-CM | POA: Diagnosis not present

## 2024-03-14 DIAGNOSIS — Z800 Family history of malignant neoplasm of digestive organs: Secondary | ICD-10-CM | POA: Diagnosis not present

## 2024-03-14 DIAGNOSIS — D500 Iron deficiency anemia secondary to blood loss (chronic): Secondary | ICD-10-CM | POA: Diagnosis present

## 2024-03-14 DIAGNOSIS — R198 Other specified symptoms and signs involving the digestive system and abdomen: Secondary | ICD-10-CM | POA: Diagnosis not present

## 2024-03-14 DIAGNOSIS — Z8042 Family history of malignant neoplasm of prostate: Secondary | ICD-10-CM | POA: Diagnosis not present

## 2024-03-14 LAB — CYTOLOGY - PAP
Comment: NEGATIVE
Diagnosis: NEGATIVE
High risk HPV: NEGATIVE

## 2024-03-14 LAB — IRON AND TIBC
Iron: 18 ug/dL — ABNORMAL LOW (ref 28–170)
Saturation Ratios: 4 % — ABNORMAL LOW (ref 10.4–31.8)
TIBC: 500 ug/dL — ABNORMAL HIGH (ref 250–450)
UIBC: 482 ug/dL

## 2024-03-14 LAB — CBC WITH DIFFERENTIAL/PLATELET
Abs Immature Granulocytes: 0.03 K/uL (ref 0.00–0.07)
Basophils Absolute: 0 K/uL (ref 0.0–0.1)
Basophils Relative: 1 %
Eosinophils Absolute: 0 K/uL (ref 0.0–0.5)
Eosinophils Relative: 1 %
HCT: 34.7 % — ABNORMAL LOW (ref 36.0–46.0)
Hemoglobin: 11 g/dL — ABNORMAL LOW (ref 12.0–15.0)
Immature Granulocytes: 1 %
Lymphocytes Relative: 29 %
Lymphs Abs: 1.6 K/uL (ref 0.7–4.0)
MCH: 23 pg — ABNORMAL LOW (ref 26.0–34.0)
MCHC: 31.7 g/dL (ref 30.0–36.0)
MCV: 72.4 fL — ABNORMAL LOW (ref 80.0–100.0)
Monocytes Absolute: 0.4 K/uL (ref 0.1–1.0)
Monocytes Relative: 8 %
Neutro Abs: 3.4 K/uL (ref 1.7–7.7)
Neutrophils Relative %: 60 %
Platelets: 252 K/uL (ref 150–400)
RBC: 4.79 MIL/uL (ref 3.87–5.11)
RDW: 15.6 % — ABNORMAL HIGH (ref 11.5–15.5)
WBC: 5.6 K/uL (ref 4.0–10.5)
nRBC: 0 % (ref 0.0–0.2)

## 2024-03-14 LAB — RETIC PANEL
Immature Retic Fract: 11.2 % (ref 2.3–15.9)
RBC.: 4.77 MIL/uL (ref 3.87–5.11)
Retic Count, Absolute: 69.2 K/uL (ref 19.0–186.0)
Retic Ct Pct: 1.5 % (ref 0.4–3.1)
Reticulocyte Hemoglobin: 27.6 pg — ABNORMAL LOW (ref >27.9)

## 2024-03-14 LAB — FERRITIN: Ferritin: 4 ng/mL — ABNORMAL LOW (ref 11–307)

## 2024-03-14 NOTE — Assessment & Plan Note (Signed)
 Labs are reviewed and discussed with patient. Lab Results  Component Value Date   HGB 11.0 (L) 03/14/2024   TIBC 500 (H) 03/14/2024   IRONPCTSAT 4 (L) 03/14/2024   FERRITIN 4 (L) 03/14/2024    Lab results consistent with iron deficiency anemia.  She is not able to tolerate oral iron supplementation. I discussed about option  IV Venofer treatments. I discussed about the potential risks including but not limited to allergic reactions/infusion reactions including anaphylactic reactions, diarrhea, phlebitis, high blood pressure, wheezing, SOB, skin rash, weight gain,dark urine, leg swelling, back pain, headache, nausea and fatigue, etc. Patient tolerates oral iron supplement poorly and desires to achieved higher level of iron faster for adequate hematopoesis. Plan IV venofer weekly x 4

## 2024-03-14 NOTE — Progress Notes (Signed)
 Hematology/Oncology Consult note [phone] [phone]        REFERRING PROVIDER: Alvia Selinda PARAS, MD   CHIEF COMPLAINTS/REASON FOR VISIT:  Evaluation of iron deficiency anemia   ASSESSMENT & PLAN:   Iron deficiency anemia due to chronic blood loss Labs are reviewed and discussed with patient. Lab Results  Component Value Date   HGB 11.0 (L) 03/14/2024   TIBC 500 (H) 03/14/2024   IRONPCTSAT 4 (L) 03/14/2024   FERRITIN 4 (L) 03/14/2024    Lab results consistent with iron deficiency anemia.  She is not able to tolerate oral iron supplementation. I discussed about option  IV Venofer treatments. I discussed about the potential risks including but not limited to allergic reactions/infusion reactions including anaphylactic reactions, diarrhea, phlebitis, high blood pressure, wheezing, SOB, skin rash, weight gain,dark urine, leg swelling, back pain, headache, nausea and fatigue, etc. Patient tolerates oral iron supplement poorly and desires to achieved higher level of iron faster for adequate hematopoesis. Plan IV venofer weekly x 4   Abdominal symptoms Patient has been referred to establish care with gastroenterology for workup.  Spanish interpreter present for the entire encounter. Orders Placed This Encounter  Procedures   Ferritin    Standing Status:   Future    Number of Occurrences:   1    Expected Date:   03/14/2024    Expiration Date:   06/12/2024   Iron and TIBC    Standing Status:   Future    Number of Occurrences:   1    Expected Date:   03/14/2024    Expiration Date:   06/12/2024   CBC with Differential/Platelet    Standing Status:   Future    Number of Occurrences:   1    Expected Date:   03/14/2024    Expiration Date:   06/12/2024   Retic Panel    Standing Status:   Future    Number of Occurrences:   1    Expected Date:   03/14/2024    Expiration Date:   06/12/2024   Ambulatory referral to Social Work    Referral Priority:   Routine     Referral Type:   Consultation    Referral Reason:   Specialty Services Required    Number of Visits Requested:   1   Follow-up in 4 months. All questions were answered. The patient knows to call the clinic with any problems, questions or concerns.  Zelphia Cap, MD, PhD [facility] Hematology Oncology 03/14/2024   HISTORY OF PRESENTING ILLNESS:   Katherine Shea is a  38 y.o.  female with PMH listed below was seen in consultation at the request of  Alvia Selinda PARAS, MD  for evaluation of iron deficiency anemia.  She has a history of heavy menstrual periods lasting up to fifteen days, which have since resolved without intervention. Her current menstrual cycle is regular, occurring every twenty-eight days and lasting three days.  01/26/2024, patient's blood work showed ferritin of 7, CBC showed hemoglobin 11.6 MCV 76.  She has attempted oral iron supplements in the past year but experienced severe gastrointestinal side effects, including stomach aches and vomiting, leading to discontinuation. Taking the supplements both fasting and non-fasting resulted in vomiting.  She experiences significant gastrointestinal symptoms, including intolerance to most foods, leading to vomiting, diarrhea, or constipation. She avoids sodas, caffeine, and flour, and can only tolerate small portions of chicken and fish. Eating larger portions results in vomiting. She is currently experiencing constipation and  abdominal pain.    MEDICAL HISTORY:  Past Medical History:  Diagnosis Date   Allergy    IBS (irritable bowel syndrome)     SURGICAL HISTORY: Past Surgical History:  Procedure Laterality Date   CESAREAN SECTION  2013   TUBAL LIGATION  2013    SOCIAL HISTORY: Social History   Socioeconomic History   Marital status: Married    Spouse name: Aloysius Gab   Number of children: 2   Years of education: 16   Highest education level: Bachelor's degree (e.g., BA, AB, BS)  Occupational History    Not on file  Tobacco Use   Smoking status: Never   Smokeless tobacco: Never  Vaping Use   Vaping status: Never Used  Substance and Sexual Activity   Alcohol use: Yes    Comment: occ   Drug use: Never   Sexual activity: Yes    Birth control/protection: Surgical    Comment: Tubal Ligation  Other Topics Concern   Not on file  Social History Narrative   Not on file   Social Drivers of Health   Financial Resource Strain: High Risk (03/14/2024)   Overall Financial Resource Strain (CARDIA)    Difficulty of Paying Living Expenses: Hard  Food Insecurity: No Food Insecurity (03/14/2024)   Hunger Vital Sign    Worried About Running Out of Food in the Last Year: Never true    Ran Out of Food in the Last Year: Never true  Transportation Needs: No Transportation Needs (03/14/2024)   PRAPARE - Administrator, Civil Service (Medical): No    Lack of Transportation (Non-Medical): No  Physical Activity: Not on file  Stress: Not on file  Social Connections: Not on file  Intimate Partner Violence: Not At Risk (03/14/2024)   Humiliation, Afraid, Rape, and Kick questionnaire    Fear of Current or Ex-Partner: No    Emotionally Abused: No    Physically Abused: No    Sexually Abused: No    FAMILY HISTORY: Family History  Problem Relation Age of Onset   Hypertension Mother    Asthma Brother    Stomach cancer Maternal Aunt    Diabetes Maternal Uncle    Stomach cancer Paternal Uncle    Brain cancer Maternal Grandmother    Prostate cancer Maternal Grandfather    Asthma Daughter     ALLERGIES:  has no known allergies.  MEDICATIONS:  Current Outpatient Medications  Medication Sig Dispense Refill   atorvastatin  (LIPITOR) 10 MG tablet Take 1 tablet (10 mg total) by mouth daily. 90 tablet 0   lactulose, encephalopathy, (CHRONULAC) 10 GM/15ML SOLN Take 15-30 mLs by mouth.     ondansetron  (ZOFRAN ) 4 MG tablet TAKE 1-2 TABLET BY MOUTH EVERY 6-8 HOURS TAKE AS NEEDED FOR NAUSEA      ondansetron  (ZOFRAN -ODT) 4 MG disintegrating tablet Take 1 tablet (4 mg total) by mouth every 8 (eight) hours as needed for up to 10 days for nausea or vomiting. 20 tablet 0   polyethylene glycol (MIRALAX / GLYCOLAX) 17 g packet Take 17 g by mouth.     No current facility-administered medications for this visit.    Review of Systems  Constitutional:  Positive for fatigue. Negative for appetite change, chills and fever.  HENT:   Negative for hearing loss and voice change.   Eyes:  Negative for eye problems.  Respiratory:  Negative for chest tightness and cough.   Cardiovascular:  Negative for chest pain.  Gastrointestinal:  Positive for abdominal pain,  constipation, diarrhea and vomiting. Negative for abdominal distention and blood in stool.  Endocrine: Negative for hot flashes.  Genitourinary:  Negative for difficulty urinating and frequency.   Musculoskeletal:  Negative for arthralgias.  Skin:  Negative for itching and rash.  Neurological:  Negative for extremity weakness.  Hematological:  Negative for adenopathy.  Psychiatric/Behavioral:  Negative for confusion.    PHYSICAL EXAMINATION:  Vitals:   03/14/24 0928  BP: 116/65  Pulse: 60  Resp: 18  Temp: (!) 97.1 F (36.2 C)  SpO2: 100%   Filed Weights   03/14/24 0928  Weight: 143 lb 6.4 oz (65 kg)    Physical Exam Constitutional:      General: She is not in acute distress. HENT:     Head: Normocephalic and atraumatic.  Eyes:     General: No scleral icterus. Cardiovascular:     Rate and Rhythm: Normal rate and regular rhythm.     Heart sounds: Normal heart sounds.  Pulmonary:     Effort: Pulmonary effort is normal. No respiratory distress.     Breath sounds: No wheezing.  Abdominal:     General: Bowel sounds are normal. There is no distension.     Palpations: Abdomen is soft.  Musculoskeletal:        General: No deformity. Normal range of motion.     Cervical back: Normal range of motion and neck supple.  Skin:     General: Skin is warm and dry.     Findings: No erythema or rash.  Neurological:     Mental Status: She is alert and oriented to person, place, and time. Mental status is at baseline.     Cranial Nerves: No cranial nerve deficit.     Coordination: Coordination normal.  Psychiatric:        Mood and Affect: Mood normal.     LABORATORY DATA:  I have reviewed the data as listed    Latest Ref Rng & Units 03/14/2024   10:25 AM 01/26/2024   12:00 AM  CBC  WBC 4.0 - 10.5 K/uL 5.6  7.2    CANCELED   Hemoglobin 12.0 - 15.0 g/dL 88.9  88.3    CANCELED   Hematocrit 36.0 - 46.0 % 34.7  39.4    CANCELED   Platelets 150 - 400 K/uL 252  274    CANCELED       Latest Ref Rng & Units 01/26/2024   12:00 AM  CMP  Glucose 70 - 99 mg/dL 86   BUN 6 - 20 mg/dL 12   Creatinine 9.42 - 1.00 mg/dL 9.37   Sodium 865 - 855 mmol/L 136   Potassium 3.5 - 5.2 mmol/L 4.1   Chloride 96 - 106 mmol/L 102   CO2 20 - 29 mmol/L 20   Calcium  8.7 - 10.2 mg/dL 9.1   Total Protein 6.0 - 8.5 g/dL 7.2   Total Bilirubin 0.0 - 1.2 mg/dL 0.4   Alkaline Phos 44 - 121 IU/L 74   AST 0 - 40 IU/L 13   ALT 0 - 32 IU/L 12       RADIOGRAPHIC STUDIES: I have personally reviewed the radiological images as listed and agreed with the findings in the report. No results found.

## 2024-03-14 NOTE — Assessment & Plan Note (Signed)
 Patient has been referred to establish care with gastroenterology for workup.

## 2024-03-15 ENCOUNTER — Telehealth: Payer: Self-pay

## 2024-03-15 ENCOUNTER — Encounter: Payer: Self-pay | Admitting: Oncology

## 2024-03-15 NOTE — Telephone Encounter (Signed)
 Clinical Social Work was referred by medical provider for assessment of psychosocial needs.  CSW attempted to contact patient by phone.  Left voicemail with contact information and request for return call.

## 2024-03-15 NOTE — Progress Notes (Signed)
 Call made to pt, no answer. Detailed VM left with plan and appt details. Also informed her that she can pick up AVS at first visit.

## 2024-03-15 NOTE — Telephone Encounter (Signed)
 Rosina will you please arrange patient to get IV Venofer weekly x 4 and follow-up with Dr. Babara lab prior to MD +/- Venofer in 4 months.

## 2024-03-15 NOTE — Telephone Encounter (Signed)
-----   Message from Zelphia Cap sent at 03/14/2024  8:14 PM EDT ----- Please arrange patient to get IV Venofer weekly x 4. Follow-up lab prior to MD +/- Venofer in 4 months.  Labs are ordered.  Thank you ----- Message ----- From: Rebecka, Lab In [place] Sent: 03/14/2024  10:34 AM EDT To: Zelphia Cap, MD

## 2024-03-18 ENCOUNTER — Encounter

## 2024-03-18 ENCOUNTER — Other Ambulatory Visit

## 2024-03-18 ENCOUNTER — Telehealth: Payer: Self-pay | Admitting: Oncology

## 2024-03-18 NOTE — Telephone Encounter (Signed)
 Called pt with interpreter to r/s infusion appt from 10/6 [facility]

## 2024-03-21 ENCOUNTER — Inpatient Hospital Stay

## 2024-03-23 ENCOUNTER — Ambulatory Visit
Admission: RE | Admit: 2024-03-23 | Discharge: 2024-03-23 | Disposition: A | Discharge status: Home / Self Care | Source: Ambulatory Visit | Attending: Obstetrics and Gynecology | Admitting: Obstetrics and Gynecology

## 2024-03-23 DIAGNOSIS — N644 Mastodynia: Secondary | ICD-10-CM | POA: Diagnosis present

## 2024-03-23 DIAGNOSIS — R2232 Localized swelling, mass and lump, left upper limb: Secondary | ICD-10-CM | POA: Insufficient documentation

## 2024-03-23 DIAGNOSIS — R928 Other abnormal and inconclusive findings on diagnostic imaging of breast: Secondary | ICD-10-CM | POA: Diagnosis present

## 2024-03-23 DIAGNOSIS — Z1231 Encounter for screening mammogram for malignant neoplasm of breast: Secondary | ICD-10-CM | POA: Insufficient documentation

## 2024-03-24 ENCOUNTER — Ambulatory Visit: Payer: Self-pay | Admitting: Obstetrics and Gynecology

## 2024-03-25 ENCOUNTER — Ambulatory Visit: Admitting: Family Medicine

## 2024-03-25 ENCOUNTER — Encounter: Payer: Self-pay | Admitting: Oncology

## 2024-03-25 ENCOUNTER — Inpatient Hospital Stay: Attending: Oncology

## 2024-03-25 ENCOUNTER — Ambulatory Visit (INDEPENDENT_AMBULATORY_CARE_PROVIDER_SITE_OTHER): Admitting: Family Medicine

## 2024-03-25 ENCOUNTER — Encounter: Payer: Self-pay | Admitting: Family Medicine

## 2024-03-25 VITALS — BP 112/70 | HR 94 | Ht 62.0 in | Wt 142.0 lb

## 2024-03-25 VITALS — BP 124/73 | HR 69 | Temp 96.7°F | Resp 18

## 2024-03-25 DIAGNOSIS — K2100 Gastro-esophageal reflux disease with esophagitis, without bleeding: Secondary | ICD-10-CM

## 2024-03-25 DIAGNOSIS — D508 Other iron deficiency anemias: Secondary | ICD-10-CM | POA: Diagnosis not present

## 2024-03-25 DIAGNOSIS — F32A Depression, unspecified: Secondary | ICD-10-CM

## 2024-03-25 DIAGNOSIS — D500 Iron deficiency anemia secondary to blood loss (chronic): Secondary | ICD-10-CM | POA: Diagnosis present

## 2024-03-25 DIAGNOSIS — F419 Anxiety disorder, unspecified: Secondary | ICD-10-CM | POA: Diagnosis not present

## 2024-03-25 MED ORDER — PANTOPRAZOLE SODIUM 40 MG PO TBEC: DELAYED_RELEASE_TABLET | ORAL | 3 refills | Status: AC

## 2024-03-25 MED ORDER — IRON SUCROSE 20 MG/ML IV SOLN
200.0000 mg | Freq: Once | INTRAVENOUS | Status: AC
  Administered 2024-03-25: 200 mg via INTRAVENOUS
  Filled 2024-03-25: qty 10

## 2024-03-25 MED ORDER — LIDOCAINE VISCOUS HCL 2 % MT SOLN: OROMUCOSAL | 0 refills | Status: DC

## 2024-03-25 NOTE — Assessment & Plan Note (Signed)
 Bowel habits - Bowel movements improved with Metamucil, taken twice daily - More regular bowel movements than before  Constipation Constipation improved with Metamucil. Bowel movements regular. - Continue Metamucil twice daily.

## 2024-03-25 NOTE — Patient Instructions (Addendum)
 Iron Sucrose Injection Qu es este medicamento? El HIERRO SACAROSA trata los niveles bajos de hierro (anemia por deficiencia de Company secretary) en personas con enfermedad renal. El hierro es un mineral que cumple una funcin importante en la produccin de glbulos rojos, que llevan el oxgeno de los pulmones al resto del cuerpo. Este medicamento puede ser utilizado para otros usos; si tiene alguna pregunta consulte con su proveedor de atencin mdica o con su farmacutico. MARCAS COMUNES: Venofer Qu le debo informar a mi profesional de la salud antes de tomar este medicamento? Necesitan saber si usted presenta alguno de los [employer] o situaciones: Anemia no causada por niveles bajos de hierro Careers information officer altos de hierro en la sangre Enfermedad renal Enfermedad heptica Una reaccin alrgica o inusual al hierro, a otros medicamentos, alimentos, colorantes o conservantes Si est embarazada o buscando quedar embarazada Si est amamantando a un beb Cmo debo Visual merchandiser medicamento? Este medicamento es para infusin en una vena. Su equipo de atencin lo [employer] en un hospital o en un entorno clnico. Hable con su equipo de atencin sobre el uso de este medicamento en nios. Aunque se puede recetar a nios tan pequeos como de 2 aos de edad con ciertas afecciones, existen precauciones que deben tomarse. Sobredosis: Pngase en contacto inmediatamente con un centro toxicolgico o una sala de urgencia si usted cree que haya tomado demasiado medicamento.<br>ATENCIN: [employer] es solo para usted. No comparta este medicamento con nadie. Qu sucede si me olvido de una dosis? Cumpla con las citas para dosis de seguimiento. Es importante no olvidar ninguna dosis. Llame a su equipo de atencin si no puede asistir a una cita. Qu puede interactuar con este medicamento? No use este medicamento con ninguno de los siguientes productos: Deferoxamina Dimercaprol Otros  productos con hierro Industrial/product designer tambin podra Product/process development scientist con los siguientes productos: Cloranfenicol Deferasirox Puede ser que esta lista no menciona todas las posibles interacciones. Informe a su profesional de [employer] de [employer] productos a base de hierbas, medicamentos de [place] o suplementos nutritivos que est tomando. Si usted fuma, consume bebidas alcohlicas o si utiliza drogas ilegales, indqueselo tambin a su profesional de [employer]. Algunas sustancias pueden interactuar con su medicamento. A qu debo estar atento al usar [employer]? Visite a su equipo de atencin para que revise su evolucin peridicamente. Informe a su equipo de atencin si los sntomas no comienzan a mejorar o si empeoran. Usted podra necesitar realizarse [employer] de sangre mientras est usando [place]. Es posible que deba comer ms alimentos que contienen hierro. Hable con su equipo de atencin. Los alimentos que contienen hierro incluyen granos o cereales enteros, frutas secas, legumbres, guisantes, verduras de hojas verdes y rganos internos (hgado, rin). Qu efectos secundarios puedo tener al [employer] este medicamento? Efectos secundarios que debe informar a su equipo de atencin tan pronto como sea posible: Reacciones alrgicas: erupcin cutnea, comezn/picazn, urticaria, hinchazn de la cara, los labios, la lengua o la garganta Presin arterial baja: mareo, sensacin de desmayo o aturdimiento, visin borrosa Falta de aliento Efectos secundarios que generalmente no requieren atencin mdica (debe informarlos a su equipo de atencin si persisten o si son molestos): Enrojecimiento Dolor de Public house manager en las articulaciones Dolor muscular Therapist, sports, enrojecimiento o Marketing executive de la inyeccin Puede ser que esta lista no menciona todos los posibles efectos secundarios. Comunquese a su mdico por asesoramiento mdico [employer]. Usted puede  informar los efectos secundarios a Technical brewer  por telfono al 1-800-FDA-1088. Dnde debo guardar mi medicina? Este medicamento se administra en hospitales o clnicas. No se guarda en su casa. ATENCIN: Este folleto es un resumen. Puede ser que no cubra toda la posible informacin. Si usted tiene preguntas acerca de esta medicina, consulte con su mdico, su farmacutico o su profesional de Radiographer, therapeutic.  2024 Elsevier/Gold Standard (2023-03-30 00:00:00)

## 2024-03-25 NOTE — Progress Notes (Signed)
 Primary Care / Sports Medicine Office Visit  Patient Information:  Patient ID: Katherine Shea, female DOB: 1985/06/20 Age: 38 y.o. MRN: [id number]   Katherine Shea is a pleasant 38 y.o. female presenting with the following:  Chief Complaint  Patient presents with   Dysphagia    Pt c/o of difficulty swallowing for about two weeks now. She said her trachea feels like it hurts and she has been choking on her food so she is not eating much right now.    Interpreter Needed?: Yes Interpreter Name: Camera operator ID: [id number] Patient Declined Interpreter : No Patient signed [place] waiver: Yes  Information entered by :: Chassidy McAdoo, CMA  Vitals:   03/25/24 1311  BP: 112/70  Pulse: 94  SpO2: 99%   Vitals:   03/25/24 1311  Weight: 142 lb (64.4 kg)  Height: 5' 2 (1.575 m)   Body mass index is 25.97 kg/m.  Discussed the use of AI scribe software for clinical note transcription with the patient, who gave verbal consent to proceed.   Independent interpretation of notes and tests performed by another provider:   None  Procedures performed:   None  Pertinent History, Exam, Impression, and Recommendations:   Problem List Items Addressed This Visit     Absolute anemia   Constitutional symptoms - Weakness present - Concern about low iron levels - Scheduled for first iron infusion today  Iron deficiency anemia Iron deficiency anemia with low iron levels. Receiving infusion therapy. Underlying cause to be assessed with GI evaluation. - Continue scheduled iron infusions. - Ensure follow-up with gastroenterology to investigate potential causes of iron deficiency.      Anxiety and depression   Bowel habits - Bowel movements improved with Metamucil, taken twice daily - More regular bowel movements than before  Constipation Constipation improved with Metamucil. Bowel movements regular. - Continue Metamucil twice daily.       Gastroesophageal reflux disease - Primary   History of Present Illness Katherine Shea is a 38 year old female who presents with difficulty swallowing and vomiting.  Dysphagia and odynophagia - Difficulty swallowing for over one week - Sensation of tracheal pain and choking on food - Reduced oral intake due to fear of choking - No prior history of similar symptoms  Vomiting and gastrointestinal symptoms - Vomiting for over one week, more frequent at night after eating - Perception that food is not digesting properly - Vomiting required in order to sleep - No relief with Zofran  prescribed at urgent care - No relief with over-the-counter antacids used for the past week - Pantoprazole  previously taken for one month (started in August, stopped September 12th) - about 1 month ago - No fever, chills, or muscle aches  Physical Exam HEENT: Oropharynx benign, no lesions, no exudate, Nasal turbinates without inflammation. NECK: No cervical lymphadenopathy. No thyromegaly. ABDOMEN: Normoactive Bowel sounds present. Epigastric tenderness otherwise Abdomen non-tender.   Assessment and Plan Esophagitis with dysphagia and vomiting Persistent esophagitis with dysphagia and vomiting likely due to severe acid reflux. Previous Zofran  ineffective. Differential includes gastritis and esophagitis due to acid reflux. - Prescribe pantoprazole  at higher dose until GI evaluation. - Prepare viscous lidocaine  for throat pain relief. - Advise liquid diet to prevent food impaction and reduce irritation. - Encourage follow-up with gastroenterology for potential EGD and colonoscopy to assess for gastritis, esophagitis, and rule out gastrointestinal bleeding.      Relevant Medications   pantoprazole  (PROTONIX ) 40 MG tablet  lidocaine  (XYLOCAINE ) 2 % solution    Orders & Medications Medications:  Meds ordered this encounter  Medications   pantoprazole  (PROTONIX ) 40 MG tablet    Sig: Take 1 tablet (40  mg total) by mouth twice daily x 7 days then resume once daily dosing. Take on empty stomach at least 30 minutes prior to meal.    Dispense:  90 tablet    Refill:  3   lidocaine  (XYLOCAINE ) 2 % solution    Sig: 5-15 mL PO, Swish and swallow, q3h PRN (max ~8 doses/24h)    Dispense:  100 mL    Refill:  0   No orders of the defined types were placed in this encounter.    No follow-ups on file.     Selinda JINNY Ku, MD, [facility]   Primary Care Sports Medicine Primary Care and Sports Medicine at [facility]

## 2024-03-25 NOTE — Assessment & Plan Note (Signed)
 Constitutional symptoms - Weakness present - Concern about low iron levels - Scheduled for first iron infusion today  Iron deficiency anemia Iron deficiency anemia with low iron levels. Receiving infusion therapy. Underlying cause to be assessed with GI evaluation. - Continue scheduled iron infusions. - Ensure follow-up with gastroenterology to investigate potential causes of iron deficiency.

## 2024-03-25 NOTE — Patient Instructions (Signed)
 VISIT SUMMARY:  During your visit, we discussed your difficulty swallowing, vomiting, and gastrointestinal symptoms. We also reviewed your iron deficiency anemia and constipation management.  YOUR PLAN:  ESOPHAGITIS WITH DYSPHAGIA AND VOMITING: You have persistent esophagitis with difficulty swallowing and vomiting, likely due to severe acid reflux. -Start taking pantoprazole  at a higher dose until your GI evaluation (40 mg twice daily until seeing GI). -Use viscous lidocaine  to relieve throat pain. -Follow a liquid diet to prevent food impaction and reduce irritation. -Follow up with gastroenterology for a potential EGD and colonoscopy to assess for gastritis, esophagitis, and rule out gastrointestinal bleeding.  IRON DEFICIENCY ANEMIA: You have iron deficiency anemia with low iron levels and are receiving infusion therapy. -Continue with your scheduled iron infusions. -Follow up with gastroenterology to investigate potential causes of iron deficiency.  CONSTIPATION: Your constipation has improved with Metamucil and your bowel movements are regular. -Continue taking Metamucil twice daily.

## 2024-03-25 NOTE — Assessment & Plan Note (Signed)
 History of Present Illness Katherine Shea is a 38 year old female who presents with difficulty swallowing and vomiting.  Dysphagia and odynophagia - Difficulty swallowing for over one week - Sensation of tracheal pain and choking on food - Reduced oral intake due to fear of choking - No prior history of similar symptoms  Vomiting and gastrointestinal symptoms - Vomiting for over one week, more frequent at night after eating - Perception that food is not digesting properly - Vomiting required in order to sleep - No relief with Zofran  prescribed at urgent care - No relief with over-the-counter antacids used for the past week - Pantoprazole  previously taken for one month (started in August, stopped September 12th) - about 1 month ago - No fever, chills, or muscle aches  Physical Exam HEENT: Oropharynx benign, no lesions, no exudate, Nasal turbinates without inflammation. NECK: No cervical lymphadenopathy. No thyromegaly. ABDOMEN: Normoactive Bowel sounds present. Epigastric tenderness otherwise Abdomen non-tender.   Assessment and Plan Esophagitis with dysphagia and vomiting Persistent esophagitis with dysphagia and vomiting likely due to severe acid reflux. Previous Zofran  ineffective. Differential includes gastritis and esophagitis due to acid reflux. - Prescribe pantoprazole  at higher dose until GI evaluation. - Prepare viscous lidocaine  for throat pain relief. - Advise liquid diet to prevent food impaction and reduce irritation. - Encourage follow-up with gastroenterology for potential EGD and colonoscopy to assess for gastritis, esophagitis, and rule out gastrointestinal bleeding.

## 2024-03-28 ENCOUNTER — Inpatient Hospital Stay

## 2024-03-28 ENCOUNTER — Ambulatory Visit: Admitting: Gastroenterology

## 2024-03-28 VITALS — BP 121/69 | HR 60 | Temp 97.2°F | Resp 18

## 2024-03-28 DIAGNOSIS — D500 Iron deficiency anemia secondary to blood loss (chronic): Secondary | ICD-10-CM | POA: Diagnosis not present

## 2024-03-28 MED ORDER — IRON SUCROSE 20 MG/ML IV SOLN
200.0000 mg | Freq: Once | INTRAVENOUS | Status: AC
  Administered 2024-03-28: 200 mg via INTRAVENOUS
  Filled 2024-03-28: qty 10

## 2024-03-28 NOTE — Progress Notes (Deleted)
 Katherine Shea

## 2024-04-04 ENCOUNTER — Inpatient Hospital Stay (HOSPITAL_BASED_OUTPATIENT_CLINIC_OR_DEPARTMENT_OTHER): Admitting: Nurse Practitioner

## 2024-04-04 ENCOUNTER — Inpatient Hospital Stay

## 2024-04-04 VITALS — BP 133/75 | HR 65 | Temp 96.7°F | Resp 16

## 2024-04-04 DIAGNOSIS — D500 Iron deficiency anemia secondary to blood loss (chronic): Secondary | ICD-10-CM | POA: Diagnosis not present

## 2024-04-04 DIAGNOSIS — R55 Syncope and collapse: Secondary | ICD-10-CM

## 2024-04-04 DIAGNOSIS — R110 Nausea: Secondary | ICD-10-CM

## 2024-04-04 MED ORDER — IRON SUCROSE 20 MG/ML IV SOLN
200.0000 mg | Freq: Once | INTRAVENOUS | Status: AC
  Administered 2024-04-04: 200 mg via INTRAVENOUS
  Filled 2024-04-04: qty 10

## 2024-04-04 MED ORDER — SODIUM CHLORIDE 0.9 % IV SOLN
Freq: Once | INTRAVENOUS | Status: DC | PRN
  Filled 2024-04-04: qty 250

## 2024-04-04 MED ORDER — ONDANSETRON HCL 4 MG/2ML IJ SOLN
4.0000 mg | Freq: Once | INTRAMUSCULAR | Status: AC
  Administered 2024-04-04: 4 mg via INTRAVENOUS

## 2024-04-04 MED ORDER — SODIUM CHLORIDE 0.9% FLUSH
10.0000 mL | Freq: Once | INTRAVENOUS | Status: DC | PRN
  Filled 2024-04-04: qty 10

## 2024-04-04 NOTE — Progress Notes (Signed)
 "  Symptom Management Clinic  [facility] at [facility] A Department of the [place]. [facility] [address] [place], [place] [address] [phone] (phone) [phone] (fax)  Patient Care Team: Alvia Selinda PARAS, MD as PCP - General (Family Medicine) Babara Call, MD as Consulting Physician (Oncology)   Name of the patient: Katherine Shea  [id number]  04-14-1986   Date of visit: 04/04/24  Diagnosis- Iron  Deficiency  Chief complaint/ Reason for visit- Possible Infusion Reaction  Heme/Onc history:  Oncology History   No history exists.    Interval history- Katherine Shea is a 38 year old female who is receiving venofer  infusion and reported to nursing dizziness and near syncope. I was called to chair side to evaluate. She received half of her iron  infusion and became lightheaded and nauseous. No vomiting. Has had 2 prior infusions and tolerated well.   Review of systems- Review of Systems  Constitutional:  Positive for diaphoresis.  HENT:  Negative for sore throat.   Respiratory:  Negative for cough, shortness of breath and wheezing.   Cardiovascular:  Negative for chest pain and palpitations.  Gastrointestinal:  Positive for nausea. Negative for abdominal pain and vomiting.  Skin:  Negative for itching and rash.  Neurological:  Positive for dizziness. Negative for sensory change, speech change, loss of consciousness and weakness.  Psychiatric/Behavioral:  The patient is nervous/anxious.      No Known Allergies  Past Medical History:  Diagnosis Date   Allergy    IBS (irritable bowel syndrome)    Past Surgical History:  Procedure Laterality Date   CESAREAN SECTION  2013   TUBAL LIGATION  2013    Current Outpatient Medications:    atorvastatin  (LIPITOR) 10 MG tablet, Take 1 tablet (10 mg total) by mouth daily., Disp: 90 tablet, Rfl: 0   lidocaine  (XYLOCAINE ) 2 % solution, 5-15 mL PO, Swish and swallow, q3h PRN (max  ~8 doses/24h), Disp: 100 mL, Rfl: 0   ondansetron  (ZOFRAN ) 4 MG tablet, TAKE 1-2 TABLET BY MOUTH EVERY 6-8 HOURS TAKE AS NEEDED FOR NAUSEA, Disp: , Rfl:    pantoprazole  (PROTONIX ) 40 MG tablet, Take 1 tablet (40 mg total) by mouth twice daily x 7 days then resume once daily dosing. Take on empty stomach at least 30 minutes prior to meal., Disp: 90 tablet, Rfl: 3 No current facility-administered medications for this visit.  Facility-Administered Medications Ordered in Other Visits:    0.9 %  sodium chloride  infusion, , Intravenous, Once PRN, Babara Call, MD, Last Rate: 999 mL/hr at 04/04/24 0950, New Bag at 04/04/24 0950   sodium chloride  flush (NS) 0.9 % injection 10 mL, 10 mL, Intracatheter, Once PRN, Babara Call, MD  Physical exam: There were no vitals filed for this visit. Physical Exam Vitals reviewed.  Constitutional:      Appearance: She is diaphoretic.  Cardiovascular:     Pulses: Normal pulses.     Heart sounds: Normal heart sounds.  Pulmonary:     Effort: No respiratory distress.     Breath sounds: Normal breath sounds.  Neurological:     Mental Status: She is alert and oriented to person, place, and time.  Psychiatric:        Mood and Affect: Mood is anxious.     Assessment and plan- Patient is a 38 y.o. female currently receiving iv venofer  for iron  deficiency anemia. Asked to evaluate patient for possible reaction. Suspect vasovagal episode, less likely allergy. She received iv  fluids and zofran  4 mg IV. Symptoms resolved. She then received the remainder of the iv iron  and tolerated well. Will plan to add claritin prior to her future medications.    Visit Diagnosis 1. Vasovagal near-syncope    Patient expressed understanding and was in agreement with this plan. She also understands that She can call clinic at any time with any questions, concerns, or complaints.   Thank you for allowing me to participate in the care of this very pleasant patient.   Tinnie Dawn, DNP, AGNP-C,  AOCNP [facility] [phone]  "

## 2024-04-04 NOTE — Progress Notes (Signed)
 Patient here for her 3rd IV venofer today. About half way through the IV push, she complained of dizziness and stated that she might pass out. Called NP to chairside and started IV NS @ 999. Tinnie Dawn, NP to chairside, evaluated patient and stated that it was more likely a vasovagal reaction than an allergic reaction. Patient c/o of nausea. Zofran  4 mg IV push given per Tinnie Dawn, NP. IV fluids given for 30 minutes and patient returned to baseline. Remainder of iron pushed slowly over several minutes without further incident. Patient discharged in stable, ambulatory condition. Per lauren Dawn, NP, patient to receive PO Claritin as a premed prior to future iron infusions.

## 2024-04-05 ENCOUNTER — Other Ambulatory Visit

## 2024-04-05 ENCOUNTER — Telehealth: Payer: Self-pay | Admitting: Obstetrics and Gynecology

## 2024-04-05 NOTE — Telephone Encounter (Signed)
 Reached out to pt via interpreter about pelvic US  that was scheduled on 04/05/2024 at 10:15 per ABC.  Left message via interpreter for pt to call back to reschedule.

## 2024-04-06 NOTE — Telephone Encounter (Signed)
 Reached out to pt (2x) via interpreter about pelvic US  that was scheduled on 04/05/2024 at 10:15 per ABC.  Was able to reschedule the appt to 04/18/2024 at 2:00.

## 2024-04-11 ENCOUNTER — Inpatient Hospital Stay

## 2024-04-11 VITALS — BP 123/79 | HR 67 | Temp 97.5°F | Resp 17

## 2024-04-11 DIAGNOSIS — D500 Iron deficiency anemia secondary to blood loss (chronic): Secondary | ICD-10-CM | POA: Diagnosis not present

## 2024-04-11 MED ORDER — SODIUM CHLORIDE 0.9% FLUSH
10.0000 mL | Freq: Once | INTRAVENOUS | Status: AC | PRN
  Administered 2024-04-11: 10 mL
  Filled 2024-04-11: qty 10

## 2024-04-11 MED ORDER — IRON SUCROSE 20 MG/ML IV SOLN
200.0000 mg | Freq: Once | INTRAVENOUS | Status: AC
  Administered 2024-04-11: 200 mg via INTRAVENOUS

## 2024-04-11 NOTE — Progress Notes (Signed)
 1455: Pt reports she took Claritin 1 hour ago.

## 2024-04-18 ENCOUNTER — Ambulatory Visit

## 2024-04-18 DIAGNOSIS — N920 Excessive and frequent menstruation with regular cycle: Secondary | ICD-10-CM | POA: Diagnosis not present

## 2024-04-18 DIAGNOSIS — D500 Iron deficiency anemia secondary to blood loss (chronic): Secondary | ICD-10-CM

## 2024-04-19 ENCOUNTER — Telehealth: Payer: Self-pay | Admitting: Obstetrics and Gynecology

## 2024-04-19 NOTE — Telephone Encounter (Signed)
 Pt aware of neg GYN u/s results for menorrhagia/IDA. Menses have improved over the past yr. Seeing hematology for Fe infusions bc couldn't tolerate oral Fe in past. Discussed POPs, IUD, lysteda, ablation. Pt interested in hyst with BSO. Not candidate for BSO with increased risks at her age, hyst not first line tx for sx. Pt declines IUD. Would be interested in ablation. RTO with MD for consult/EMB.

## 2024-05-04 ENCOUNTER — Other Ambulatory Visit: Payer: Self-pay | Admitting: Family Medicine

## 2024-05-06 NOTE — Telephone Encounter (Signed)
 Requested Prescriptions  Pending Prescriptions Disp Refills   atorvastatin  (LIPITOR) 10 MG tablet [Pharmacy Med Name: ATORVASTATIN  10 MG TABLET] 90 tablet 2    Sig: TAKE 1 TABLET BY MOUTH EVERY DAY     Cardiovascular:  Antilipid - Statins Failed - 05/06/2024  3:00 PM      Failed - Lipid Panel in normal range within the last 12 months    Cholesterol, Total  Date Value Ref Range Status  01/26/2024 221 (H) 100 - 199 mg/dL Final   LDL Chol Calc (NIH)  Date Value Ref Range Status  01/26/2024 118 (H) 0 - 99 mg/dL Final   HDL  Date Value Ref Range Status  01/26/2024 37 (L) >39 mg/dL Final   Triglycerides  Date Value Ref Range Status  01/26/2024 379 (H) 0 - 149 mg/dL Final         Passed - Patient is not pregnant      Passed - Valid encounter within last 12 months    Recent Outpatient Visits           1 month ago Gastroesophageal reflux disease with esophagitis without hemorrhage   [place] [facility], Selinda PARAS, MD   2 months ago Other iron  deficiency anemia   [place] [facility], Selinda PARAS, MD   3 months ago Chronic constipation   [facility] at [facility], Selinda PARAS, MD       Future Appointments             In 3 weeks Ku, Selinda PARAS, MD [facility] at [facility], [phone] [place]

## 2024-05-26 NOTE — H&P (View-Only) (Signed)
 "   GYNECOLOGY PROGRESS NOTE  Subjective:  PCP: Alvia Selinda PARAS, MD  Patient ID: Katherine Shea, female    DOB: [birth year], 38 y.o.   MRN: [id number]  HPI  Patient is a 38 y.o. [id number] female who presents for endometrial ablation consult.  She was referred to Bernarda Perfect PA-C by her PCP for menorrhagia with iron  deficiency anemia.  She is followed by hematology.  TVUS done 04/18/24 was normal.  She is s/p BTL and would like hysterectomy with BSO but was told this was not a first line treatment for symptoms.  She declined IUD, does not want anything with hormones, and would like an ablation instead.  Period Cycle (Days): 28 Period Duration (Days): 5-10 Period Pattern: Regular Menstrual Flow: Light, Heavy Menstrual Control: Maxi pad Menstrual Control Change Freq (Hours): 6 Dysmenorrhea: (!) Mild Dysmenorrhea Symptoms: Cramping, Nausea, Diarrhea, Headache  OB History  Gravida Para Term Preterm AB Living  2 2 0 2  2  SAB IAB Ectopic Multiple Live Births      2    # Outcome Date GA Lbr Len/2nd Weight Sex Type Anes PTL Lv  2 Preterm      Vag-Spont     1 Preterm      CS-Unspec      Past Medical History:  Diagnosis Date   Allergy    IBS (irritable bowel syndrome)    Past Surgical History:  Procedure Laterality Date   CESAREAN SECTION  2013   TUBAL LIGATION  2013   Family History  Problem Relation Age of Onset   Hypertension Mother    Asthma Brother    Stomach cancer Maternal Aunt    Diabetes Maternal Uncle    Stomach cancer Paternal Uncle    Brain cancer Maternal Grandmother    Prostate cancer Maternal Grandfather    Asthma Daughter    Social History   Socioeconomic History   Marital status: Married    Spouse name: Aloysius Gab   Number of children: 2   Years of education: 16   Highest education level: Bachelor's degree (e.g., BA, AB, BS)  Occupational History   Not on file  Tobacco Use   Smoking status: Never   Smokeless tobacco: Never  Vaping Use    Vaping status: Never Used  Substance and Sexual Activity   Alcohol use: Yes    Comment: occ   Drug use: Never   Sexual activity: Yes    Birth control/protection: Surgical    Comment: Tubal Ligation  Other Topics Concern   Not on file  Social History Narrative   Not on file   Social Drivers of Health   Tobacco Use: Low Risk (06/01/2024)   Patient History    Smoking Tobacco Use: Never    Smokeless Tobacco Use: Never    Passive Exposure: Not on file  Financial Resource Strain: High Risk (03/14/2024)   Overall Financial Resource Strain (CARDIA)    Difficulty of Paying Living Expenses: Hard  Food Insecurity: No Food Insecurity (03/14/2024)   Epic    Worried About Radiation Protection Practitioner of Food in the Last Year: Never true    Ran Out of Food in the Last Year: Never true  Transportation Needs: No Transportation Needs (03/14/2024)   Epic    Lack of Transportation (Medical): No    Lack of Transportation (Non-Medical): No  Physical Activity: Not on file  Stress: Not on file  Social Connections: Not on file  Intimate Partner Violence: Not At Risk (03/14/2024)  Epic    Fear of Current or Ex-Partner: No    Emotionally Abused: No    Physically Abused: No    Sexually Abused: No  Depression (PHQ2-9): Medium Risk (03/25/2024)   Depression (PHQ2-9)    PHQ-2 Score: 7  Alcohol Screen: Not on file  Housing: High Risk (07/16/2023)   Received from [facility]   Epic    In the last 12 months, was there a time when you were not able to pay the mortgage or rent on time?: Yes    Number of Times Moved in the Last Year: Not on file    At any time in the past 12 months, were you homeless or living in a shelter (including now)?: No  Utilities: Not At Risk (03/14/2024)   Epic    Threatened with loss of utilities: No  Health Literacy: Not on file   Medications Ordered Prior to Encounter[1]  Allergies[2]   Review of Systems Pertinent items are noted in HPI.   Objective:   Blood  pressure 124/74, pulse (!) 54, weight 144 lb (65.3 kg), last menstrual period 05/23/2024. Body mass index is 26.34 kg/m.  General appearance: alert and cooperative Abdomen: soft, non-tender; bowel sounds normal; no masses,  no organomegaly Pelvic: cervix normal in appearance, external genitalia normal, no adnexal masses or tenderness, no cervical motion tenderness, rectovaginal septum normal, uterus normal size, shape, and consistency, and vagina normal without discharge Extremities: extremities normal, atraumatic, no cyanosis or edema Neurologic: Grossly normal  Endometrial Biopsy Procedure Note  The patient was positioned on the exam table in the dorsal lithotomy position. Bimanual exam confirmed uterine position and size. A Graves speculum was placed into the vagina. A single toothed tenaculum was placed onto the anterior lip of the cervix. The pipette was placed into the endocervical canal and advanced to the uterine fundus. Using a piston like technique, with vacuum created by withdrawing the stylus, the endometrial specimen was obtained and transferred to the biopsy container. Minimal bleeding encountered. The procedure was well tolerated.    Uterine Position: mid   Uterine Length: 7cm   Uterine Specimen: Average    Ultrasound 04/18/24 Indications:Menorrhagia with regular cycle. Findings:  The uterus is anteverted and measures 9.5 x 4.0 x 5.3 cm with a uterine volume of 106.08 ml. Echo texture is homogenous without evidence of focal masses.   The Endometrium measures 11.9 mm.   Right Ovary measures 4.1 x 2.5 x 3.3 cm. Left Ovary was not identified transabdominally or vaginally. Survey of the adnexa demonstrates no adnexal masses. There is some free fluid seen in the posterior cul de sac.   Impression: 1. Large Right consistent with known PCOS. 2. Unable to visualize Left Ovary. 3. Some free fluid noted in posterior cul-de-sac.   Recommendations: 1.Clinical correlation with  the patient's History and Physical Exam.   Berwyn DELENA Hummer, RDMS (AB,OB,BR),RVT   Clinical Impression and recommendations:   I have reviewed the sonogram results above, combined with the patient's current clinical course, below are my impressions and any appropriate recommendations for management based on the sonographic findings.   Normal uterine size and shape. Normal endometrium. Normal right ovary.  Left ovary not seen. Small amount of free fluid noted in posterior cul-de-sac.    Assessment/Plan:   1. Menorrhagia with regular cycle   2. S/P tubal ligation    38 y.o. [id number] s/p tubal ligation, with HMB and regular periods, desiring endometrial ablation for management. Risks of surgery were discussed with  the patient including but not limited to: bleeding which may require transfusion; infection which may require antibiotics; injury to uterus leading to risk of injury to surrounding intraperitoneal organs, need for additional procedures including laparoscopy or laparotomy, and other postoperative/anesthesia complications. Patient would like to proceed, all questions answered.  -Requesting 06/27/24 -Pre-op instructions reviewed -NPO at midnight -Post-op 3-4wks   Abdulkarim Eberlin, DO [facility]    [1]  Current Outpatient Medications on File Prior to Visit  Medication Sig Dispense Refill   atorvastatin  (LIPITOR) 10 MG tablet TAKE 1 TABLET BY MOUTH EVERY DAY 90 tablet 2   pantoprazole  (PROTONIX ) 40 MG tablet Take 1 tablet (40 mg total) by mouth twice daily x 7 days then resume once daily dosing. Take on empty stomach at least 30 minutes prior to meal. 90 tablet 3   lidocaine  (XYLOCAINE ) 2 % solution 5-15 mL PO, Swish and swallow, q3h PRN (max ~8 doses/24h) (Patient not taking: Reported on 06/01/2024) 100 mL 0   ondansetron  (ZOFRAN ) 4 MG tablet TAKE 1-2 TABLET BY MOUTH EVERY 6-8 HOURS TAKE AS NEEDED FOR NAUSEA (Patient not taking: Reported on 06/01/2024)     No  current facility-administered medications on file prior to visit.  [2] No Known Allergies  "

## 2024-05-26 NOTE — Progress Notes (Unsigned)
° ° °  GYNECOLOGY PROGRESS NOTE  Subjective:  PCP: Alvia Selinda PARAS, MD  Patient ID: Katherine Shea, female    DOB: [birth year], 38 y.o.   MRN: [id number]  HPI  Patient is a 38 y.o. [id number] female who presents for endometrial ablation consult.  She was referred to Bernarda Perfect PA-C by her PCP for menorrhagia with iron  deficiency anemia.  She is followed by hematology.  TVUS done 04/18/24 was normal.  She would like hysterectomy with BSO but was told this was not a first line treatment for symptoms.  She declined IUD and would like an ablation instead.   {Common ambulatory SmartLinks:19316}  Review of Systems {ros; complete:30496}   Objective:   There were no vitals taken for this visit. There is no height or weight on file to calculate BMI.  General appearance: {general exam:16600} Abdomen: {abdominal exam:16834} Pelvic: {pelvic exam:16852::cervix normal in appearance,external genitalia normal,no adnexal masses or tenderness,no cervical motion tenderness,rectovaginal septum normal,uterus normal size, shape, and consistency,vagina normal without discharge} Extremities: {extremity exam:5109} Neurologic: {neuro exam:17854}  Ultrasound 04/18/24 Indications:Menorrhagia with regular cycle. Findings:  The uterus is anteverted and measures 9.5 x 4.0 x 5.3 cm with a uterine volume of 106.08 ml. Echo texture is homogenous without evidence of focal masses.   The Endometrium measures 11.9 mm.   Right Ovary measures 4.1 x 2.5 x 3.3 cm. Left Ovary was not identified transabdominally or vaginally. Survey of the adnexa demonstrates no adnexal masses. There is some free fluid seen in the posterior cul de sac.   Impression: 1. Large Right consistent with known PCOS. 2. Unable to visualize Left Ovary. 3. Some free fluid noted in posterior cul-de-sac.   Recommendations: 1.Clinical correlation with the patient's History and Physical Exam.   Berwyn DELENA Hummer, RDMS  (AB,OB,BR),RVT   Clinical Impression and recommendations:   I have reviewed the sonogram results above, combined with the patient's current clinical course, below are my impressions and any appropriate recommendations for management based on the sonographic findings.   Normal uterine size and shape. Normal endometrium. Normal right ovary.  Left ovary not seen. Small amount of free fluid noted in posterior cul-de-sac.    Assessment/Plan:   No diagnosis found.   There are no diagnoses linked to this encounter.     Estil Mangle, DO [facility]

## 2024-05-27 ENCOUNTER — Ambulatory Visit: Admitting: Family Medicine

## 2024-06-01 ENCOUNTER — Ambulatory Visit: Admitting: Obstetrics

## 2024-06-01 ENCOUNTER — Encounter: Payer: Self-pay | Admitting: Obstetrics

## 2024-06-01 ENCOUNTER — Other Ambulatory Visit (HOSPITAL_COMMUNITY)
Admission: RE | Admit: 2024-06-01 | Discharge: 2024-06-01 | Disposition: A | Source: Ambulatory Visit | Attending: Obstetrics | Admitting: Obstetrics

## 2024-06-01 ENCOUNTER — Encounter: Payer: Self-pay | Admitting: Oncology

## 2024-06-01 VITALS — BP 124/74 | HR 54 | Wt 144.0 lb

## 2024-06-01 DIAGNOSIS — N858 Other specified noninflammatory disorders of uterus: Secondary | ICD-10-CM

## 2024-06-01 DIAGNOSIS — N920 Excessive and frequent menstruation with regular cycle: Secondary | ICD-10-CM | POA: Diagnosis present

## 2024-06-01 DIAGNOSIS — Z9851 Tubal ligation status: Secondary | ICD-10-CM

## 2024-06-03 LAB — SURGICAL PATHOLOGY

## 2024-06-13 ENCOUNTER — Encounter: Payer: Self-pay | Admitting: Oncology

## 2024-06-15 ENCOUNTER — Ambulatory Visit: Admitting: Family Medicine

## 2024-06-21 ENCOUNTER — Encounter
Admission: RE | Admit: 2024-06-21 | Discharge: 2024-06-21 | Disposition: A | Discharge status: Home / Self Care | Source: Ambulatory Visit | Attending: Obstetrics | Admitting: Obstetrics

## 2024-06-21 ENCOUNTER — Other Ambulatory Visit: Payer: Self-pay

## 2024-06-21 HISTORY — DX: Hyperlipidemia, unspecified: E78.5

## 2024-06-21 NOTE — Patient Instructions (Addendum)
 Your procedure is scheduled on: MONDAY 06/27/24 Report to the Registration Desk on the 1st floor of the [facility]. To find out your arrival time, please call [phone] between 1PM - 3PM on: FRIDAY 06/24/24 If your arrival time is 6:00 am, do not arrive before that time as the [facility] entrance doors do not open until 6:00 am.  REMEMBER: Instructions that are not followed completely may result in serious medical risk, up to and including death; or upon the discretion of your surgeon and anesthesiologist your surgery may need to be rescheduled.  Do not eat food after midnight the night before surgery.  No gum chewing or hard candies.  You may however, drink CLEAR liquids up to 2 hours before you are scheduled to arrive for your surgery. Do not drink anything within 2 hours of your scheduled arrival time.  Clear liquids include: - water  - apple juice without pulp - gatorade (not RED colors) - black coffee or tea (Do NOT add milk or creamers to the coffee or tea) Do NOT drink anything that is not on this list.  One week prior to surgery: Stop Anti-inflammatories (NSAIDS) such as Advil, Aleve , Ibuprofen, Motrin, Naproxen , Naprosyn  and Aspirin based products such as Excedrin, Goody's Powder, BC Powder. Stop ANY OVER THE COUNTER supplements until after surgery.  You may however, continue to take Tylenol  if needed for pain up until the day of surgery.  Continue taking all of your other prescription medications up until the day of surgery.  ON THE DAY OF SURGERY ONLY TAKE THESE MEDICATIONS WITH SIPS OF WATER:  pantoprazole  (PROTONIX )   Use inhalers on the day of surgery and bring to the hospital.  No Alcohol for 24 hours before or after surgery.  No Smoking including e-cigarettes for 24 hours before surgery.  No chewable tobacco products for at least 6 hours before surgery.  No nicotine patches on the day of surgery.  Do not use any recreational drugs for at least a week  (preferably 2 weeks) before your surgery.  Please be advised that the combination of cocaine and anesthesia may have negative outcomes, up to and including death. If you test positive for cocaine, your surgery will be cancelled.  On the morning of surgery brush your teeth with toothpaste and water, you may rinse your mouth with mouthwash if you wish. Do not swallow any toothpaste or mouthwash.  Do not wear jewelry, make-up, hairpins, clips or nail polish.  For welded (permanent) jewelry: bracelets, anklets, waist bands, etc.  Please have this removed prior to surgery.  If it is not removed, there is a chance that hospital personnel will need to cut it off on the day of surgery.  Do not wear lotions, powders, or perfumes.   Do not shave body hair from the neck down 48 hours before surgery.  Contact lenses, hearing aids and dentures may not be worn into surgery.  Do not bring valuables to the hospital. [facility] is not responsible for any missing/lost belongings or valuables.   Notify your doctor if there is any change in your medical condition (cold, fever, infection).  Wear comfortable clothing (specific to your surgery type) to the hospital.  After surgery, you can help prevent lung complications by doing breathing exercises.  Take deep breaths and cough every 1-2 hours. Your doctor may order a device called an Incentive Spirometer to help you take deep breaths.  If you are being discharged the day of surgery, you will not  be allowed to drive home. You will need a responsible individual to drive you home and stay with you for 24 hours after surgery.   If you are taking public transportation, you will need to have a responsible individual with you.  Please call the Pre-admissions Testing Dept. at [phone] if you have any questions about these instructions.  Surgery Visitation Policy:  Patients having surgery or a procedure may have two visitors.  Children under the age of  58 must have an adult with them who is not the patient.  Merchandiser, Retail to address health-related social needs:  [email or web address]   Your procedure is scheduled on:06/27/24 Su procedimiento est programado para: Report to Day Surgery. Presntese a: To find out your arrival time please call [phone] between 1PM - 3PM on 06/24/24. Para saber su hora de llegada por favor llame al [phone] entre la 1PM - 3PM el da: 06/24/24   Remember: Instructions that are not followed completely may result in serious medical risk, up to and including death,  or upon the discretion of your surgeon and anesthesiologist your surgery may need to be rescheduled.  Recuerde: Las instrucciones que no se siguen completamente armed forces logistics/support/administrative officer en un riesgo de salud grave, incluyendo hasta  la [place] o a discrecin de su cirujano y scientific laboratory technician, su ciruga se puede posponer.  __X_ 1.Do not eat food after midnight the night before your procedure. No gum chewing or hard candies. You may drink clear liquids up to 2 hours before you are scheduled to arrive for your surgery- DO not drink clear liquids within 2 hours of the start of your surgery.    No coma nada despus de la medianoche de la noche anterior a su procedimiento. No coma chicles ni caramelos duros. Puede tomar lquidos claros hasta 2 horas antes de su hora programada de llegada al  hospital para su procedimiento. No tome lquidos claros durante el transcurso de las 2 horas de su llegada programada al hospital para su   procedimiento, ya que esto puede llevar a que su procedimiento se retrase o tenga que volver a programarse.    _X__ 2.Do Not Smoke or use e-cigarettes For 24 Hours Prior to Your Surgery.   Do not use any chewable tobacco products for at least 6 hours prior to surgery.  No fume ni use cigarrillos electrnicos durante las 24 horas previas a su ciruga.  No use ningn producto de tabaco masticable durante al menos 6  horas antes de la ciruga.    __X_ 3. No alcohol for 24 hours before or after surgery. No tome alcohol durante las 24 horas antes ni despus de la ciruga.  ____4. Bring all medications with you on the day of surgery if instructed.  Lleve todos los medicamentos con usted el da de su ciruga si se le ha indicado as.  ____ 5. Notify your doctor if there is any change in your medical condition (cold,fever, infections).  Informe a su mdico si hay algn cambio en su condicin mdica (resfriado, fiebre, infecciones).  Do not wear jewelry, make-up, hairpins, clips or nail polish. No use joyas, maquillajes, pinzas/ganchos para el cabello ni esmalte de uas. Do not wear lotions, powders, or perfumes. You may wear deodorant. No use lociones, polvos o perfumes.  Puede usar desodorante.   Do not shave 48 hours prior to surgery. Men may shave face and neck. No se afeite 48 horas antes de la ciruga.  Los hombres pueden commercial metals company cara y [place]  cuello.  Do not bring valuables to the hospital.   No lleve objetos de valor al hospital.  [facility] is not responsible for any belongings or valuables. [facility] no se hace responsable de ningn tipo de pertenencias u objetos de valor.   Contacts, dentures or bridgework may not be worn into surgery. Los lentes de [place], las dentaduras postizas o puentes no se pueden usar en la ciruga.     Patients discharged the day of surgery will not be allowed to drive home. A los pacientes que se les da de alta el mismo da de la ciruga no se les permitir conducir a higher education careers adviser.   Please read over the following fact sheets that you were given: Por favor lea las siguientes hojas de informacin que le dieron:   Take these medicines the morning of surgery with A SIP OF WATER:          [employer] la maana de la ciruga con UN SORBO DE AGUA: pantoprazole  (PROTONIX )     Stop Anti-inflammatories on 06/21/24          Deje de tomar antiinflamatorios el  da:   Stop supplements until after surgery            Deje de tomar suplementos hasta despus de la ciruga

## 2024-06-23 ENCOUNTER — Encounter: Payer: Self-pay | Admitting: Oncology

## 2024-06-27 ENCOUNTER — Encounter: Payer: Self-pay | Admitting: Oncology

## 2024-06-27 ENCOUNTER — Ambulatory Visit
Admission: RE | Admit: 2024-06-27 | Discharge: 2024-06-27 | Disposition: A | Attending: Obstetrics | Admitting: Obstetrics

## 2024-06-27 ENCOUNTER — Other Ambulatory Visit: Payer: Self-pay

## 2024-06-27 ENCOUNTER — Ambulatory Visit

## 2024-06-27 ENCOUNTER — Encounter: Payer: Self-pay | Admitting: Obstetrics

## 2024-06-27 ENCOUNTER — Encounter
Admission: RE | Disposition: A | Discharge status: Home / Self Care | Payer: Self-pay | Source: Home / Self Care | Attending: Obstetrics

## 2024-06-27 DIAGNOSIS — N946 Dysmenorrhea, unspecified: Secondary | ICD-10-CM | POA: Diagnosis not present

## 2024-06-27 DIAGNOSIS — D500 Iron deficiency anemia secondary to blood loss (chronic): Secondary | ICD-10-CM

## 2024-06-27 DIAGNOSIS — N920 Excessive and frequent menstruation with regular cycle: Secondary | ICD-10-CM | POA: Diagnosis present

## 2024-06-27 DIAGNOSIS — Z9851 Tubal ligation status: Secondary | ICD-10-CM | POA: Diagnosis not present

## 2024-06-27 HISTORY — PX: HYSTEROSCOPY: SHX211

## 2024-06-27 LAB — POCT PREGNANCY, URINE: Preg Test, Ur: NEGATIVE

## 2024-06-27 MED ORDER — DEXAMETHASONE SOD PHOSPHATE PF 10 MG/ML IJ SOLN
INTRAMUSCULAR | Status: DC | PRN
  Administered 2024-06-27: 10 mg via INTRAVENOUS

## 2024-06-27 MED ORDER — SODIUM CHLORIDE 0.9 % IR SOLN
Status: DC | PRN
  Administered 2024-06-27: 885 mL

## 2024-06-27 MED ORDER — ACETAMINOPHEN 10 MG/ML IV SOLN: 1000.0000 mg | Freq: Once | INTRAVENOUS | Status: DC | PRN

## 2024-06-27 MED ORDER — OXYCODONE HCL 5 MG PO TABS
ORAL_TABLET | ORAL | Status: AC
  Filled 2024-06-27: qty 1

## 2024-06-27 MED ORDER — EPHEDRINE SULFATE-NACL 50-0.9 MG/10ML-% IV SOSY
PREFILLED_SYRINGE | INTRAVENOUS | Status: DC | PRN
  Administered 2024-06-27: 5 mg via INTRAVENOUS

## 2024-06-27 MED ORDER — MIDAZOLAM HCL (PF) 2 MG/2ML IJ SOLN
INTRAMUSCULAR | Status: DC | PRN
  Administered 2024-06-27: 2 mg via INTRAVENOUS

## 2024-06-27 MED ORDER — PROPOFOL 1000 MG/100ML IV EMUL
INTRAVENOUS | Status: AC
  Filled 2024-06-27: qty 100

## 2024-06-27 MED ORDER — IBUPROFEN 800 MG PO TABS: 800.0000 mg | ORAL_TABLET | Freq: Three times a day (TID) | ORAL | 0 refills | Status: AC | PRN

## 2024-06-27 MED ORDER — OXYCODONE HCL 5 MG/5ML PO SOLN: 5.0000 mg | Freq: Once | ORAL | Status: AC | PRN

## 2024-06-27 MED ORDER — ONDANSETRON HCL 4 MG/2ML IJ SOLN
INTRAMUSCULAR | Status: DC | PRN
  Administered 2024-06-27: 4 mg via INTRAVENOUS

## 2024-06-27 MED ORDER — FENTANYL CITRATE (PF) 100 MCG/2ML IJ SOLN
25.0000 ug | INTRAMUSCULAR | Status: DC | PRN
  Administered 2024-06-27 (×4): 25 ug via INTRAVENOUS

## 2024-06-27 MED ORDER — LIDOCAINE HCL (CARDIAC) PF 100 MG/5ML IV SOSY
PREFILLED_SYRINGE | INTRAVENOUS | Status: DC | PRN
  Administered 2024-06-27: 60 mg via INTRAVENOUS

## 2024-06-27 MED ORDER — OXYCODONE-ACETAMINOPHEN 5-325 MG PO TABS: 1.0000 | ORAL_TABLET | Freq: Three times a day (TID) | ORAL | 0 refills | Status: AC | PRN

## 2024-06-27 MED ORDER — CHLORHEXIDINE GLUCONATE 0.12 % MT SOLN
15.0000 mL | Freq: Once | OROMUCOSAL | Status: AC
  Administered 2024-06-27: 15 mL via OROMUCOSAL

## 2024-06-27 MED ORDER — MIDAZOLAM HCL 2 MG/2ML IJ SOLN
INTRAMUSCULAR | Status: AC
  Filled 2024-06-27: qty 2

## 2024-06-27 MED ORDER — DEXAMETHASONE SOD PHOSPHATE PF 10 MG/ML IJ SOLN
INTRAMUSCULAR | Status: AC
  Filled 2024-06-27: qty 1

## 2024-06-27 MED ORDER — LACTATED RINGERS IV SOLN: INTRAVENOUS | Status: DC

## 2024-06-27 MED ORDER — DROPERIDOL 2.5 MG/ML IJ SOLN
INTRAMUSCULAR | Status: AC
  Filled 2024-06-27: qty 2

## 2024-06-27 MED ORDER — PROPOFOL 10 MG/ML IV BOLUS
INTRAVENOUS | Status: DC | PRN
  Administered 2024-06-27: 50 mg via INTRAVENOUS
  Administered 2024-06-27: 100 mg via INTRAVENOUS

## 2024-06-27 MED ORDER — EPHEDRINE 5 MG/ML INJ
INTRAVENOUS | Status: AC
  Filled 2024-06-27: qty 5

## 2024-06-27 MED ORDER — DROPERIDOL 2.5 MG/ML IJ SOLN
0.6250 mg | Freq: Once | INTRAMUSCULAR | Status: AC | PRN
  Administered 2024-06-27: 0.625 mg via INTRAVENOUS

## 2024-06-27 MED ORDER — LIDOCAINE HCL (PF) 2 % IJ SOLN
INTRAMUSCULAR | Status: AC
  Filled 2024-06-27: qty 5

## 2024-06-27 MED ORDER — SILVER NITRATE-POT NITRATE 75-25 % EX MISC
CUTANEOUS | Status: DC | PRN
  Administered 2024-06-27: 2

## 2024-06-27 MED ORDER — ORAL CARE MOUTH RINSE: 15.0000 mL | Freq: Once | OROMUCOSAL | Status: AC

## 2024-06-27 MED ORDER — ONDANSETRON HCL 4 MG/2ML IJ SOLN
INTRAMUSCULAR | Status: AC
  Filled 2024-06-27: qty 2

## 2024-06-27 MED ORDER — CHLORHEXIDINE GLUCONATE 0.12 % MT SOLN
OROMUCOSAL | Status: AC
  Filled 2024-06-27: qty 15

## 2024-06-27 MED ORDER — OXYCODONE HCL 5 MG PO TABS
5.0000 mg | ORAL_TABLET | Freq: Once | ORAL | Status: AC | PRN
  Administered 2024-06-27: 5 mg via ORAL

## 2024-06-27 MED ORDER — FENTANYL CITRATE (PF) 100 MCG/2ML IJ SOLN
INTRAMUSCULAR | Status: AC
  Filled 2024-06-27: qty 2

## 2024-06-27 MED ORDER — FENTANYL CITRATE (PF) 100 MCG/2ML IJ SOLN
INTRAMUSCULAR | Status: DC | PRN
  Administered 2024-06-27: 50 ug via INTRAVENOUS
  Administered 2024-06-27: 25 ug via INTRAVENOUS

## 2024-06-27 MED ORDER — SILVER NITRATE-POT NITRATE 75-25 % EX MISC
CUTANEOUS | Status: AC
  Filled 2024-06-27: qty 10

## 2024-06-27 NOTE — Transfer of Care (Signed)
 Immediate Anesthesia Transfer of Care Note  Patient: Katherine Shea  Procedure(s) Performed: ABLATION, ENDOMETRIUM, HYSTEROSCOPIC  Patient Location: PACU  Anesthesia Type:General  Level of Consciousness: sedated  Airway & Oxygen Therapy: Patient Spontanous Breathing and Patient connected to face mask oxygen  Post-op Assessment: Report given to RN  Post vital signs: Reviewed and stable  Last Vitals:  Vitals Value Taken Time  BP 97/55 06/27/24 08:45  Temp 36 C 06/27/24 08:44  Pulse 72 06/27/24 08:57  Resp 13 06/27/24 08:57  SpO2 100 % 06/27/24 08:57  Vitals shown include unfiled device data.  Last Pain:  Vitals:   06/27/24 0844  TempSrc:   PainSc: Asleep         Complications: No notable events documented.

## 2024-06-27 NOTE — Op Note (Signed)
 OPERATIVE REPORT  DATE OF PROCEDURE: 06/27/2024 PREOPERATIVE DIAGNOSIS:  Heavy menstrual bleeding, anemia due to chronic blood loss POSTOPERATIVE DIAGNOSIS: The same PROCEDURE:  Hysteroscopy, NovaSure Endometrial Ablation SURGEON:  Dr. Estil Mangle  INDICATIONS: 39 y.o. G2P0202  here for hysteroscopy with NovaSure Endometrial Ablation for heavy menstrual bleeding leading to anemia, preferring to avoid hormonal medications.  Risks of surgery were discussed with the patient including but not limited to: bleeding which may require transfusion; infection which may require antibiotics; injury to uterus leading to risk of injury to surrounding intraperitoneal organs, need for additional procedures including laparoscopy or laparotomy, and other postoperative/anesthesia complications. Written informed consent was obtained.   FINDINGS:  A normal sized, mobile, anteverted uterus.  Diffuse proliferative endometrium.  Normal ostia bilaterally.  ANESTHESIA:   General INTRAVENOUS FLUIDS:  Normal saline; In: 885cc, Out: 725cc; Deficit: 160cc ESTIMATED BLOOD LOSS: < 5cc SPECIMENS: None COMPLICATIONS:  None  PROCEDURE DETAILS:  The patient was taken to the operating room where general anesthesia was administered and was found to be adequate.  After a timeout was performed, she was placed in the dorsal lithotomy position and examined; then prepped and draped in the sterile manner.   Her bladder was catheterized for an unmeasured amount of clear, yellow urine. A speculum was then placed in the patient's vagina and a single tooth tenaculum was applied to the anterior lip of the cervix.  The sound was used to obtain the cervical and uterine cavity length measurements at 3 cm and 5 cm respectively; total sounding length 8 cm.  The cervix was dilated manually with Hegar dilators to accommodate the diagnostic hysteroscope.  Once the cervix was dilated, the hysteroscope was inserted under direct visualization.   The  hysteroscope was also used to determine the level of the internal os, and measurements were confirmed. The uterine cavity was carefully examined, both ostia were recognized, and diffusely proliferative endometrium was noted. The cervix was further dilated to accommodate the NovaSure device.  The NovaSure device was inserted, and a cavity width of 3.7 cm was determined. Using a power of 102 watts, for 1 min 26 sec, the endometrial ablation was performed. The hysteroscope was then re-introduced into the uterine cavity, confirming complete ablation of the endometrium. The tenaculum was removed from the anterior lip of the cervix, silver  nitrite was applied to one tenaculum site, and the vaginal speculum was removed after noting good hemostasis.  The patient tolerated the procedure well and was taken to the recovery area awake, extubated and in stable condition.  The patient will be discharged to home as per PACU criteria.  Routine postoperative instructions given.  She was prescribed Percocet, Ibuprofen .  She will follow up in the clinic in 3-4 weeks for postoperative evaluation.   Estil Mangle, DO [facility]

## 2024-06-27 NOTE — Anesthesia Preprocedure Evaluation (Signed)
"                                    Anesthesia Evaluation  Patient identified by MRN, date of birth, ID band Patient awake    Reviewed: Allergy & Precautions, H&P , NPO status , Patient's Chart, lab work & pertinent test results, reviewed documented beta blocker date and time   Airway Mallampati: II  TM Distance: >3 FB Neck ROM: full    Dental  (+) Teeth Intact   Pulmonary neg pulmonary ROS   Pulmonary exam normal        Cardiovascular Exercise Tolerance: Good negative cardio ROS Normal cardiovascular exam Rate:Normal     Neuro/Psych  Headaches PSYCHIATRIC DISORDERS Anxiety Depression     Neuromuscular disease    GI/Hepatic Neg liver ROS,GERD  Medicated and Controlled,,  Endo/Other  negative endocrine ROS    Renal/GU negative Renal ROS  negative genitourinary   Musculoskeletal   Abdominal   Peds  Hematology  (+) Blood dyscrasia, anemia   Anesthesia Other Findings   Reproductive/Obstetrics negative OB ROS                              Anesthesia Physical Anesthesia Plan  ASA: 2  Anesthesia Plan: General LMA   Post-op Pain Management:    Induction:   PONV Risk Score and Plan: 4 or greater  Airway Management Planned:   Additional Equipment:   Intra-op Plan:   Post-operative Plan:   Informed Consent: I have reviewed the patients History and Physical, chart, labs and discussed the procedure including the risks, benefits and alternatives for the proposed anesthesia with the patient or authorized representative who has indicated his/her understanding and acceptance.       Plan Discussed with: CRNA  Anesthesia Plan Comments:         Anesthesia Quick Evaluation  "

## 2024-06-27 NOTE — Anesthesia Procedure Notes (Signed)
 Procedure Name: LMA Insertion Date/Time: 06/27/2024 7:58 AM  Performed by: Veronica Alm BROCKS, CRNAPre-anesthesia Checklist: Patient identified, Emergency Drugs available, Suction available and Patient being monitored Patient Re-evaluated:Patient Re-evaluated prior to induction Oxygen Delivery Method: Circle system utilized Preoxygenation: Pre-oxygenation with 100% oxygen Induction Type: IV induction Ventilation: Mask ventilation without difficulty LMA: LMA inserted LMA Size: 3.0 Number of attempts: 1 Placement Confirmation: positive ETCO2, CO2 detector and breath sounds checked- equal and bilateral Tube secured with: Tape

## 2024-06-27 NOTE — Interval H&P Note (Signed)
 History and Physical Interval Note:  06/27/2024 7:31 AM  [name]  has presented today for surgery, with the diagnosis of Menorrhagia.  The various methods of treatment have been discussed with the patient and family. After consideration of risks, benefits and other options for treatment, the patient has consented to  Procedures with comments: ABLATION, ENDOMETRIUM, HYSTEROSCOPIC (N/A) - Novasure as a surgical intervention.  The patient's history has been reviewed, patient examined, no change in status, stable for surgery.  I have reviewed the patient's chart and labs.  Questions were answered to the patient's satisfaction.     Georgianne Gritz

## 2024-06-28 ENCOUNTER — Encounter: Payer: Self-pay | Admitting: Family Medicine

## 2024-06-28 ENCOUNTER — Encounter: Payer: Self-pay | Admitting: Obstetrics

## 2024-06-28 ENCOUNTER — Ambulatory Visit: Admitting: Family Medicine

## 2024-06-29 ENCOUNTER — Encounter: Payer: Self-pay | Admitting: Obstetrics

## 2024-07-01 NOTE — Anesthesia Postprocedure Evaluation (Signed)
"   Anesthesia Post Note  Patient: Katherine Shea  Procedure(s) Performed: ABLATION, ENDOMETRIUM, HYSTEROSCOPIC  Patient location during evaluation: PACU Anesthesia Type: General Level of consciousness: awake and alert Pain management: pain level controlled Vital Signs Assessment: post-procedure vital signs reviewed and stable Respiratory status: spontaneous breathing, nonlabored ventilation, respiratory function stable and patient connected to nasal cannula oxygen Cardiovascular status: blood pressure returned to baseline and stable Postop Assessment: no apparent nausea or vomiting Anesthetic complications: no   No notable events documented.   Last Vitals:  Vitals:   06/27/24 1030 06/27/24 1124  BP: (!) 107/56 106/61  Pulse: 68 80  Resp: 16 15  Temp: (!) 36.3 C (!) 36.1 C  SpO2: 96% 98%    Last Pain:  Vitals:   06/27/24 1124  TempSrc:   PainSc: 0-No pain                 Lynwood [name]      "

## 2024-07-08 DIAGNOSIS — Z0289 Encounter for other administrative examinations: Secondary | ICD-10-CM

## 2024-07-08 NOTE — Progress Notes (Signed)
" ° ° °  OBSTETRICS/GYNECOLOGY POST-OPERATIVE CLINIC VISIT  Subjective:     Katherine Shea is a 39 y.o. female who presents to the clinic 3 weeks status post endometrial ablation for menorrhagia with iron  deficiency anemia. Eating a regular diet without difficulty. Bowel movements are normal. Pain is controlled with current analgesics. Medications being used: ibuprofen  (OTC). Is having light, blood-tinged discharge intermittently, no symptoms. Is supposed to start period today so is excited to see if it's improved and lighter.   The following portions of the patient's history were reviewed and updated as appropriate: allergies, current medications, past family history, past medical history, past social history, past surgical history, and problem list.  Review of Systems Pertinent items are noted in HPI.   Objective:   BP 122/65   Pulse 63   Wt 142 lb (64.4 kg)   BMI 25.97 kg/m  Body mass index is 25.97 kg/m.  General:  alert and no distress  Abdomen: soft, bowel sounds active, non-tender  Incision:   N/A    Pathology:  N/A  Assessment:   57bn [id number] s/p endometrial ablation on 06/27/24, here for post-op, doing well. Discussed her spotting, which sounds to be in the range of expected, normal post-op bleeding.    Plan:   1. Continue any current medications as instructed by provider. 2. Wound care discussed. 3. Operative findings again reviewed. Pathology report discussed. Photos reviewed and provided to patient.  4. Activity restrictions: none 5. Anticipated return to work: not applicable. 6. Follow up: Sep 2026 annual exam, sooner prn   Estil Mangle, DO [facility]

## 2024-07-11 ENCOUNTER — Inpatient Hospital Stay

## 2024-07-12 ENCOUNTER — Inpatient Hospital Stay

## 2024-07-12 ENCOUNTER — Inpatient Hospital Stay: Attending: Oncology

## 2024-07-12 ENCOUNTER — Encounter: Payer: Self-pay | Admitting: Obstetrics

## 2024-07-18 ENCOUNTER — Ambulatory Visit: Admitting: Oncology

## 2024-07-18 ENCOUNTER — Ambulatory Visit

## 2024-07-18 ENCOUNTER — Inpatient Hospital Stay

## 2024-07-19 ENCOUNTER — Ambulatory Visit: Admitting: Obstetrics

## 2024-07-19 ENCOUNTER — Encounter: Payer: Self-pay | Admitting: Obstetrics

## 2024-07-19 VITALS — BP 122/65 | HR 63 | Wt 142.0 lb

## 2024-07-19 DIAGNOSIS — Z4889 Encounter for other specified surgical aftercare: Secondary | ICD-10-CM
